# Patient Record
Sex: Male | Born: 1955
Health system: Southern US, Community
[De-identification: ages and names within clinical notes are randomized; demographics above are authoritative.]

## PROBLEM LIST (undated history)

## (undated) DIAGNOSIS — G8929 Other chronic pain: Secondary | ICD-10-CM

## (undated) DIAGNOSIS — M1990 Unspecified osteoarthritis, unspecified site: Secondary | ICD-10-CM

## (undated) DIAGNOSIS — K219 Gastro-esophageal reflux disease without esophagitis: Secondary | ICD-10-CM

## (undated) DIAGNOSIS — H409 Unspecified glaucoma: Secondary | ICD-10-CM

## (undated) DIAGNOSIS — M549 Dorsalgia, unspecified: Secondary | ICD-10-CM

## (undated) DIAGNOSIS — Z87442 Personal history of urinary calculi: Secondary | ICD-10-CM

## (undated) HISTORY — PX: TYMPANOPLASTY: SHX33

## (undated) HISTORY — PX: ORCHIECTOMY: SHX2116

## (undated) HISTORY — PX: EYE SURGERY: SHX253

---

## 2001-02-23 ENCOUNTER — Other Ambulatory Visit: Admission: RE | Admit: 2001-02-23 | Discharge: 2001-02-23 | Payer: Self-pay | Admitting: Family Medicine

## 2012-04-14 ENCOUNTER — Ambulatory Visit (INDEPENDENT_AMBULATORY_CARE_PROVIDER_SITE_OTHER): Payer: Medicare HMO | Admitting: Urology

## 2012-04-14 DIAGNOSIS — E291 Testicular hypofunction: Secondary | ICD-10-CM

## 2012-04-14 DIAGNOSIS — N529 Male erectile dysfunction, unspecified: Secondary | ICD-10-CM

## 2013-10-19 ENCOUNTER — Other Ambulatory Visit: Payer: Self-pay | Admitting: Neurology

## 2013-10-19 DIAGNOSIS — IMO0002 Reserved for concepts with insufficient information to code with codable children: Secondary | ICD-10-CM

## 2013-10-30 ENCOUNTER — Ambulatory Visit (HOSPITAL_COMMUNITY): Payer: Medicare HMO

## 2013-11-01 ENCOUNTER — Ambulatory Visit (HOSPITAL_COMMUNITY)
Admission: RE | Admit: 2013-11-01 | Discharge: 2013-11-01 | Disposition: A | Discharge status: Home / Self Care | Payer: Medicare HMO | Source: Ambulatory Visit | Attending: Neurology | Admitting: Neurology

## 2013-11-01 DIAGNOSIS — M545 Low back pain, unspecified: Secondary | ICD-10-CM | POA: Insufficient documentation

## 2013-11-01 DIAGNOSIS — R209 Unspecified disturbances of skin sensation: Secondary | ICD-10-CM | POA: Insufficient documentation

## 2013-11-01 DIAGNOSIS — M79609 Pain in unspecified limb: Secondary | ICD-10-CM | POA: Insufficient documentation

## 2013-11-01 DIAGNOSIS — IMO0002 Reserved for concepts with insufficient information to code with codable children: Secondary | ICD-10-CM

## 2014-07-12 DIAGNOSIS — M5416 Radiculopathy, lumbar region: Secondary | ICD-10-CM | POA: Diagnosis not present

## 2014-07-12 DIAGNOSIS — M545 Low back pain: Secondary | ICD-10-CM | POA: Diagnosis not present

## 2014-08-01 DIAGNOSIS — H401221 Low-tension glaucoma, left eye, mild stage: Secondary | ICD-10-CM | POA: Diagnosis not present

## 2014-09-09 DIAGNOSIS — R635 Abnormal weight gain: Secondary | ICD-10-CM | POA: Diagnosis not present

## 2014-09-09 DIAGNOSIS — R252 Cramp and spasm: Secondary | ICD-10-CM | POA: Diagnosis not present

## 2014-09-09 DIAGNOSIS — Z79899 Other long term (current) drug therapy: Secondary | ICD-10-CM | POA: Diagnosis not present

## 2014-09-09 DIAGNOSIS — M5416 Radiculopathy, lumbar region: Secondary | ICD-10-CM | POA: Diagnosis not present

## 2014-09-30 DIAGNOSIS — K1379 Other lesions of oral mucosa: Secondary | ICD-10-CM | POA: Diagnosis not present

## 2014-11-08 DIAGNOSIS — H40053 Ocular hypertension, bilateral: Secondary | ICD-10-CM | POA: Diagnosis not present

## 2014-11-08 DIAGNOSIS — H401213 Low-tension glaucoma, right eye, severe stage: Secondary | ICD-10-CM | POA: Diagnosis not present

## 2014-11-08 DIAGNOSIS — H11153 Pinguecula, bilateral: Secondary | ICD-10-CM | POA: Diagnosis not present

## 2014-11-08 DIAGNOSIS — H401221 Low-tension glaucoma, left eye, mild stage: Secondary | ICD-10-CM | POA: Diagnosis not present

## 2014-12-03 DIAGNOSIS — R252 Cramp and spasm: Secondary | ICD-10-CM | POA: Diagnosis not present

## 2014-12-03 DIAGNOSIS — R635 Abnormal weight gain: Secondary | ICD-10-CM | POA: Diagnosis not present

## 2014-12-03 DIAGNOSIS — M545 Low back pain: Secondary | ICD-10-CM | POA: Diagnosis not present

## 2014-12-03 DIAGNOSIS — G894 Chronic pain syndrome: Secondary | ICD-10-CM | POA: Diagnosis not present

## 2014-12-03 DIAGNOSIS — M25519 Pain in unspecified shoulder: Secondary | ICD-10-CM | POA: Diagnosis not present

## 2014-12-03 DIAGNOSIS — Z79899 Other long term (current) drug therapy: Secondary | ICD-10-CM | POA: Diagnosis not present

## 2014-12-03 DIAGNOSIS — M5416 Radiculopathy, lumbar region: Secondary | ICD-10-CM | POA: Diagnosis not present

## 2014-12-10 DIAGNOSIS — R739 Hyperglycemia, unspecified: Secondary | ICD-10-CM | POA: Diagnosis not present

## 2014-12-10 DIAGNOSIS — J309 Allergic rhinitis, unspecified: Secondary | ICD-10-CM | POA: Diagnosis not present

## 2014-12-10 DIAGNOSIS — G894 Chronic pain syndrome: Secondary | ICD-10-CM | POA: Diagnosis not present

## 2014-12-10 DIAGNOSIS — B351 Tinea unguium: Secondary | ICD-10-CM | POA: Diagnosis not present

## 2014-12-11 DIAGNOSIS — H401221 Low-tension glaucoma, left eye, mild stage: Secondary | ICD-10-CM | POA: Diagnosis not present

## 2014-12-11 DIAGNOSIS — H401213 Low-tension glaucoma, right eye, severe stage: Secondary | ICD-10-CM | POA: Diagnosis not present

## 2015-02-04 DIAGNOSIS — Z79899 Other long term (current) drug therapy: Secondary | ICD-10-CM | POA: Diagnosis not present

## 2015-02-04 DIAGNOSIS — M5416 Radiculopathy, lumbar region: Secondary | ICD-10-CM | POA: Diagnosis not present

## 2015-02-04 DIAGNOSIS — R252 Cramp and spasm: Secondary | ICD-10-CM | POA: Diagnosis not present

## 2015-02-04 DIAGNOSIS — M79606 Pain in leg, unspecified: Secondary | ICD-10-CM | POA: Diagnosis not present

## 2015-02-14 DIAGNOSIS — M5136 Other intervertebral disc degeneration, lumbar region: Secondary | ICD-10-CM | POA: Diagnosis not present

## 2015-02-14 DIAGNOSIS — M47816 Spondylosis without myelopathy or radiculopathy, lumbar region: Secondary | ICD-10-CM | POA: Diagnosis not present

## 2015-02-21 DIAGNOSIS — G8929 Other chronic pain: Secondary | ICD-10-CM | POA: Diagnosis not present

## 2015-02-21 DIAGNOSIS — K219 Gastro-esophageal reflux disease without esophagitis: Secondary | ICD-10-CM | POA: Diagnosis not present

## 2015-02-21 DIAGNOSIS — M47816 Spondylosis without myelopathy or radiculopathy, lumbar region: Secondary | ICD-10-CM | POA: Diagnosis not present

## 2015-02-21 DIAGNOSIS — Z79899 Other long term (current) drug therapy: Secondary | ICD-10-CM | POA: Diagnosis not present

## 2015-02-21 DIAGNOSIS — F329 Major depressive disorder, single episode, unspecified: Secondary | ICD-10-CM | POA: Diagnosis not present

## 2015-02-21 DIAGNOSIS — F419 Anxiety disorder, unspecified: Secondary | ICD-10-CM | POA: Diagnosis not present

## 2015-02-21 DIAGNOSIS — H269 Unspecified cataract: Secondary | ICD-10-CM | POA: Diagnosis not present

## 2015-02-21 DIAGNOSIS — M5136 Other intervertebral disc degeneration, lumbar region: Secondary | ICD-10-CM | POA: Diagnosis not present

## 2015-03-06 DIAGNOSIS — M5416 Radiculopathy, lumbar region: Secondary | ICD-10-CM | POA: Diagnosis not present

## 2015-03-06 DIAGNOSIS — Z79899 Other long term (current) drug therapy: Secondary | ICD-10-CM | POA: Diagnosis not present

## 2015-03-06 DIAGNOSIS — M79606 Pain in leg, unspecified: Secondary | ICD-10-CM | POA: Diagnosis not present

## 2015-03-06 DIAGNOSIS — R252 Cramp and spasm: Secondary | ICD-10-CM | POA: Diagnosis not present

## 2015-03-07 DIAGNOSIS — G894 Chronic pain syndrome: Secondary | ICD-10-CM | POA: Diagnosis not present

## 2015-03-07 DIAGNOSIS — R739 Hyperglycemia, unspecified: Secondary | ICD-10-CM | POA: Diagnosis not present

## 2015-03-07 DIAGNOSIS — B351 Tinea unguium: Secondary | ICD-10-CM | POA: Diagnosis not present

## 2015-03-07 DIAGNOSIS — R42 Dizziness and giddiness: Secondary | ICD-10-CM | POA: Diagnosis not present

## 2015-03-25 DIAGNOSIS — M47816 Spondylosis without myelopathy or radiculopathy, lumbar region: Secondary | ICD-10-CM | POA: Diagnosis not present

## 2015-03-25 DIAGNOSIS — M5136 Other intervertebral disc degeneration, lumbar region: Secondary | ICD-10-CM | POA: Diagnosis not present

## 2015-04-01 ENCOUNTER — Other Ambulatory Visit (HOSPITAL_COMMUNITY): Payer: Self-pay | Admitting: Nurse Practitioner

## 2015-04-01 DIAGNOSIS — M47816 Spondylosis without myelopathy or radiculopathy, lumbar region: Secondary | ICD-10-CM

## 2015-04-01 DIAGNOSIS — M51369 Other intervertebral disc degeneration, lumbar region without mention of lumbar back pain or lower extremity pain: Secondary | ICD-10-CM

## 2015-04-01 DIAGNOSIS — M5136 Other intervertebral disc degeneration, lumbar region: Secondary | ICD-10-CM

## 2015-04-14 ENCOUNTER — Ambulatory Visit (HOSPITAL_COMMUNITY)
Admission: RE | Admit: 2015-04-14 | Discharge: 2015-04-14 | Disposition: A | Discharge status: Home / Self Care | Payer: Commercial Managed Care - HMO | Source: Ambulatory Visit | Attending: Nurse Practitioner | Admitting: Nurse Practitioner

## 2015-04-14 DIAGNOSIS — M5136 Other intervertebral disc degeneration, lumbar region: Secondary | ICD-10-CM

## 2015-04-14 DIAGNOSIS — M47816 Spondylosis without myelopathy or radiculopathy, lumbar region: Secondary | ICD-10-CM

## 2015-04-14 DIAGNOSIS — M545 Low back pain: Secondary | ICD-10-CM | POA: Diagnosis not present

## 2015-05-06 DIAGNOSIS — Z0100 Encounter for examination of eyes and vision without abnormal findings: Secondary | ICD-10-CM | POA: Diagnosis not present

## 2015-05-30 DIAGNOSIS — B370 Candidal stomatitis: Secondary | ICD-10-CM | POA: Diagnosis not present

## 2015-05-30 DIAGNOSIS — B085 Enteroviral vesicular pharyngitis: Secondary | ICD-10-CM | POA: Diagnosis not present

## 2015-06-04 DIAGNOSIS — M2550 Pain in unspecified joint: Secondary | ICD-10-CM | POA: Diagnosis not present

## 2015-06-04 DIAGNOSIS — R635 Abnormal weight gain: Secondary | ICD-10-CM | POA: Diagnosis not present

## 2015-06-04 DIAGNOSIS — M545 Low back pain: Secondary | ICD-10-CM | POA: Diagnosis not present

## 2015-06-04 DIAGNOSIS — R252 Cramp and spasm: Secondary | ICD-10-CM | POA: Diagnosis not present

## 2015-06-04 DIAGNOSIS — G894 Chronic pain syndrome: Secondary | ICD-10-CM | POA: Diagnosis not present

## 2015-06-04 DIAGNOSIS — M5416 Radiculopathy, lumbar region: Secondary | ICD-10-CM | POA: Diagnosis not present

## 2015-06-04 DIAGNOSIS — K219 Gastro-esophageal reflux disease without esophagitis: Secondary | ICD-10-CM | POA: Diagnosis not present

## 2015-06-04 DIAGNOSIS — M25519 Pain in unspecified shoulder: Secondary | ICD-10-CM | POA: Diagnosis not present

## 2015-08-05 DIAGNOSIS — M5136 Other intervertebral disc degeneration, lumbar region: Secondary | ICD-10-CM | POA: Diagnosis not present

## 2015-08-05 DIAGNOSIS — M47816 Spondylosis without myelopathy or radiculopathy, lumbar region: Secondary | ICD-10-CM | POA: Diagnosis not present

## 2015-08-31 DIAGNOSIS — D649 Anemia, unspecified: Secondary | ICD-10-CM | POA: Diagnosis not present

## 2015-08-31 DIAGNOSIS — H409 Unspecified glaucoma: Secondary | ICD-10-CM | POA: Diagnosis not present

## 2015-08-31 DIAGNOSIS — R0902 Hypoxemia: Secondary | ICD-10-CM | POA: Diagnosis not present

## 2015-08-31 DIAGNOSIS — J189 Pneumonia, unspecified organism: Secondary | ICD-10-CM | POA: Diagnosis not present

## 2015-08-31 DIAGNOSIS — I517 Cardiomegaly: Secondary | ICD-10-CM | POA: Diagnosis not present

## 2015-08-31 DIAGNOSIS — R0602 Shortness of breath: Secondary | ICD-10-CM | POA: Diagnosis not present

## 2015-08-31 DIAGNOSIS — J441 Chronic obstructive pulmonary disease with (acute) exacerbation: Secondary | ICD-10-CM | POA: Diagnosis not present

## 2015-08-31 DIAGNOSIS — E875 Hyperkalemia: Secondary | ICD-10-CM | POA: Diagnosis not present

## 2015-08-31 DIAGNOSIS — R410 Disorientation, unspecified: Secondary | ICD-10-CM | POA: Diagnosis not present

## 2015-08-31 DIAGNOSIS — R42 Dizziness and giddiness: Secondary | ICD-10-CM | POA: Diagnosis not present

## 2015-08-31 DIAGNOSIS — J9601 Acute respiratory failure with hypoxia: Secondary | ICD-10-CM | POA: Diagnosis not present

## 2015-08-31 DIAGNOSIS — K219 Gastro-esophageal reflux disease without esophagitis: Secondary | ICD-10-CM | POA: Diagnosis not present

## 2015-09-10 DIAGNOSIS — F411 Generalized anxiety disorder: Secondary | ICD-10-CM | POA: Diagnosis not present

## 2015-09-10 DIAGNOSIS — G894 Chronic pain syndrome: Secondary | ICD-10-CM | POA: Diagnosis not present

## 2015-09-10 DIAGNOSIS — J189 Pneumonia, unspecified organism: Secondary | ICD-10-CM | POA: Diagnosis not present

## 2015-09-10 DIAGNOSIS — R739 Hyperglycemia, unspecified: Secondary | ICD-10-CM | POA: Diagnosis not present

## 2015-09-24 DIAGNOSIS — M2550 Pain in unspecified joint: Secondary | ICD-10-CM | POA: Diagnosis not present

## 2015-09-24 DIAGNOSIS — M5416 Radiculopathy, lumbar region: Secondary | ICD-10-CM | POA: Diagnosis not present

## 2015-09-24 DIAGNOSIS — R635 Abnormal weight gain: Secondary | ICD-10-CM | POA: Diagnosis not present

## 2015-09-24 DIAGNOSIS — K219 Gastro-esophageal reflux disease without esophagitis: Secondary | ICD-10-CM | POA: Diagnosis not present

## 2015-09-24 DIAGNOSIS — M25519 Pain in unspecified shoulder: Secondary | ICD-10-CM | POA: Diagnosis not present

## 2015-09-24 DIAGNOSIS — R252 Cramp and spasm: Secondary | ICD-10-CM | POA: Diagnosis not present

## 2015-09-24 DIAGNOSIS — Z79899 Other long term (current) drug therapy: Secondary | ICD-10-CM | POA: Diagnosis not present

## 2015-09-24 DIAGNOSIS — G894 Chronic pain syndrome: Secondary | ICD-10-CM | POA: Diagnosis not present

## 2015-09-24 DIAGNOSIS — M545 Low back pain: Secondary | ICD-10-CM | POA: Diagnosis not present

## 2015-10-03 DIAGNOSIS — F411 Generalized anxiety disorder: Secondary | ICD-10-CM | POA: Diagnosis not present

## 2015-10-03 DIAGNOSIS — G894 Chronic pain syndrome: Secondary | ICD-10-CM | POA: Diagnosis not present

## 2015-10-03 DIAGNOSIS — J189 Pneumonia, unspecified organism: Secondary | ICD-10-CM | POA: Diagnosis not present

## 2015-10-03 DIAGNOSIS — R739 Hyperglycemia, unspecified: Secondary | ICD-10-CM | POA: Diagnosis not present

## 2015-10-08 DIAGNOSIS — F419 Anxiety disorder, unspecified: Secondary | ICD-10-CM | POA: Diagnosis not present

## 2015-10-08 DIAGNOSIS — R7303 Prediabetes: Secondary | ICD-10-CM | POA: Diagnosis not present

## 2015-10-08 DIAGNOSIS — R73 Elevated blood glucose level: Secondary | ICD-10-CM | POA: Diagnosis not present

## 2015-10-08 DIAGNOSIS — G894 Chronic pain syndrome: Secondary | ICD-10-CM | POA: Diagnosis not present

## 2015-10-10 DIAGNOSIS — R635 Abnormal weight gain: Secondary | ICD-10-CM | POA: Diagnosis not present

## 2015-10-10 DIAGNOSIS — M2550 Pain in unspecified joint: Secondary | ICD-10-CM | POA: Diagnosis not present

## 2015-10-10 DIAGNOSIS — K219 Gastro-esophageal reflux disease without esophagitis: Secondary | ICD-10-CM | POA: Diagnosis not present

## 2015-10-10 DIAGNOSIS — Z79899 Other long term (current) drug therapy: Secondary | ICD-10-CM | POA: Diagnosis not present

## 2015-10-10 DIAGNOSIS — M25519 Pain in unspecified shoulder: Secondary | ICD-10-CM | POA: Diagnosis not present

## 2015-10-10 DIAGNOSIS — R252 Cramp and spasm: Secondary | ICD-10-CM | POA: Diagnosis not present

## 2015-10-10 DIAGNOSIS — M545 Low back pain: Secondary | ICD-10-CM | POA: Diagnosis not present

## 2015-10-10 DIAGNOSIS — G894 Chronic pain syndrome: Secondary | ICD-10-CM | POA: Diagnosis not present

## 2015-10-10 DIAGNOSIS — M5416 Radiculopathy, lumbar region: Secondary | ICD-10-CM | POA: Diagnosis not present

## 2015-10-15 DIAGNOSIS — G600 Hereditary motor and sensory neuropathy: Secondary | ICD-10-CM | POA: Diagnosis not present

## 2015-11-20 DIAGNOSIS — Z79899 Other long term (current) drug therapy: Secondary | ICD-10-CM | POA: Diagnosis not present

## 2015-11-20 DIAGNOSIS — M5416 Radiculopathy, lumbar region: Secondary | ICD-10-CM | POA: Diagnosis not present

## 2015-11-20 DIAGNOSIS — M545 Low back pain: Secondary | ICD-10-CM | POA: Diagnosis not present

## 2015-11-20 DIAGNOSIS — M2550 Pain in unspecified joint: Secondary | ICD-10-CM | POA: Diagnosis not present

## 2015-11-20 DIAGNOSIS — M25519 Pain in unspecified shoulder: Secondary | ICD-10-CM | POA: Diagnosis not present

## 2015-11-20 DIAGNOSIS — R252 Cramp and spasm: Secondary | ICD-10-CM | POA: Diagnosis not present

## 2015-11-20 DIAGNOSIS — G894 Chronic pain syndrome: Secondary | ICD-10-CM | POA: Diagnosis not present

## 2015-11-20 DIAGNOSIS — K219 Gastro-esophageal reflux disease without esophagitis: Secondary | ICD-10-CM | POA: Diagnosis not present

## 2015-11-20 DIAGNOSIS — R635 Abnormal weight gain: Secondary | ICD-10-CM | POA: Diagnosis not present

## 2015-12-04 DIAGNOSIS — M5416 Radiculopathy, lumbar region: Secondary | ICD-10-CM | POA: Diagnosis not present

## 2015-12-04 DIAGNOSIS — M545 Low back pain: Secondary | ICD-10-CM | POA: Diagnosis not present

## 2015-12-04 DIAGNOSIS — M25519 Pain in unspecified shoulder: Secondary | ICD-10-CM | POA: Diagnosis not present

## 2015-12-04 DIAGNOSIS — G894 Chronic pain syndrome: Secondary | ICD-10-CM | POA: Diagnosis not present

## 2015-12-04 DIAGNOSIS — M2550 Pain in unspecified joint: Secondary | ICD-10-CM | POA: Diagnosis not present

## 2015-12-04 DIAGNOSIS — Z79899 Other long term (current) drug therapy: Secondary | ICD-10-CM | POA: Diagnosis not present

## 2015-12-04 DIAGNOSIS — R252 Cramp and spasm: Secondary | ICD-10-CM | POA: Diagnosis not present

## 2015-12-04 DIAGNOSIS — K219 Gastro-esophageal reflux disease without esophagitis: Secondary | ICD-10-CM | POA: Diagnosis not present

## 2015-12-04 DIAGNOSIS — R635 Abnormal weight gain: Secondary | ICD-10-CM | POA: Diagnosis not present

## 2015-12-22 ENCOUNTER — Encounter: Payer: Commercial Managed Care - HMO | Admitting: Neurology

## 2015-12-24 DIAGNOSIS — F419 Anxiety disorder, unspecified: Secondary | ICD-10-CM | POA: Diagnosis not present

## 2015-12-24 DIAGNOSIS — K219 Gastro-esophageal reflux disease without esophagitis: Secondary | ICD-10-CM | POA: Diagnosis not present

## 2015-12-24 DIAGNOSIS — R739 Hyperglycemia, unspecified: Secondary | ICD-10-CM | POA: Diagnosis not present

## 2016-01-01 DIAGNOSIS — R252 Cramp and spasm: Secondary | ICD-10-CM | POA: Diagnosis not present

## 2016-01-01 DIAGNOSIS — M2550 Pain in unspecified joint: Secondary | ICD-10-CM | POA: Diagnosis not present

## 2016-01-01 DIAGNOSIS — M545 Low back pain: Secondary | ICD-10-CM | POA: Diagnosis not present

## 2016-01-01 DIAGNOSIS — K219 Gastro-esophageal reflux disease without esophagitis: Secondary | ICD-10-CM | POA: Diagnosis not present

## 2016-01-01 DIAGNOSIS — G894 Chronic pain syndrome: Secondary | ICD-10-CM | POA: Diagnosis not present

## 2016-01-01 DIAGNOSIS — M25519 Pain in unspecified shoulder: Secondary | ICD-10-CM | POA: Diagnosis not present

## 2016-01-01 DIAGNOSIS — R635 Abnormal weight gain: Secondary | ICD-10-CM | POA: Diagnosis not present

## 2016-01-01 DIAGNOSIS — Z79899 Other long term (current) drug therapy: Secondary | ICD-10-CM | POA: Diagnosis not present

## 2016-01-01 DIAGNOSIS — M5416 Radiculopathy, lumbar region: Secondary | ICD-10-CM | POA: Diagnosis not present

## 2016-01-07 ENCOUNTER — Encounter (INDEPENDENT_AMBULATORY_CARE_PROVIDER_SITE_OTHER): Payer: Self-pay | Admitting: Neurology

## 2016-01-07 ENCOUNTER — Ambulatory Visit (INDEPENDENT_AMBULATORY_CARE_PROVIDER_SITE_OTHER): Payer: Commercial Managed Care - HMO | Admitting: Neurology

## 2016-01-07 DIAGNOSIS — R202 Paresthesia of skin: Secondary | ICD-10-CM

## 2016-01-07 DIAGNOSIS — Z0289 Encounter for other administrative examinations: Secondary | ICD-10-CM

## 2016-01-08 NOTE — Procedures (Signed)
   NCS (NERVE CONDUCTION STUDY) WITH EMG (ELECTROMYOGRAPHY) REPORT   STUDY DATE:  January 08 2016 PATIENT NAME: Steven Richard DOB: Nov 29, 1955 MRN: [id number]    TECHNOLOGIST: Laretta Alstrom ELECTROMYOGRAPHER: Marcial Pacas M.D.  CLINICAL INFORMATION:  60 years old male, with history of chronic low back pain, bilateral feet paresthesia, gait abnormality, reported a family history of Charcot-Marie-Tooth disease.  On examination: Bilateral upper and lower extremity motor strength is normal. Sensory examination was intact to vibratory sensation, proprioception, pinprick, and light touch. He was areflexia.   FINDINGS: NERVE CONDUCTION STUDY: Bilateral peroneal sensory responses were normal. Bilateral peroneal to EDB and tibial motor responses were normal. Bilateral tibial H reflexes were normal and symmetric. Right median, ulnar sensory and motor responses were normal. Conduction velocity was within the normal range,   NEEDLE ELECTROMYOGRAPHY: Selected needle examinations were performed at bilateral lower extremity muscles, bilateral lumbar sacral paraspinal muscles.   Needle examination of bilateral tibialis anterior, tibialis posterior, medial gastrocnemius, peroneal longus, vastus lateralis was normal.  There was no spontaneous activity at bilateral lumbar sacral paraspinal muscles, bilateral L4-5 S1.  IMPRESSION:   This is a normal study, there is no electrodiagnostic evidence of large fiber peripheral neuropathy, lumbosacral radiculopathy.   INTERPRETING PHYSICIAN:   Marcial Pacas M.D. Ph.D. [facility] [address] [place], [place] [address] [phone]

## 2016-01-22 DIAGNOSIS — F419 Anxiety disorder, unspecified: Secondary | ICD-10-CM | POA: Diagnosis not present

## 2016-01-22 DIAGNOSIS — Z6832 Body mass index (BMI) 32.0-32.9, adult: Secondary | ICD-10-CM | POA: Diagnosis not present

## 2016-01-22 DIAGNOSIS — G894 Chronic pain syndrome: Secondary | ICD-10-CM | POA: Diagnosis not present

## 2016-01-22 DIAGNOSIS — E119 Type 2 diabetes mellitus without complications: Secondary | ICD-10-CM | POA: Diagnosis not present

## 2016-03-22 DIAGNOSIS — M5416 Radiculopathy, lumbar region: Secondary | ICD-10-CM | POA: Diagnosis not present

## 2016-03-22 DIAGNOSIS — R252 Cramp and spasm: Secondary | ICD-10-CM | POA: Diagnosis not present

## 2016-03-22 DIAGNOSIS — M25519 Pain in unspecified shoulder: Secondary | ICD-10-CM | POA: Diagnosis not present

## 2016-03-22 DIAGNOSIS — M545 Low back pain: Secondary | ICD-10-CM | POA: Diagnosis not present

## 2016-03-22 DIAGNOSIS — K219 Gastro-esophageal reflux disease without esophagitis: Secondary | ICD-10-CM | POA: Diagnosis not present

## 2016-03-22 DIAGNOSIS — M2550 Pain in unspecified joint: Secondary | ICD-10-CM | POA: Diagnosis not present

## 2016-03-22 DIAGNOSIS — Z79891 Long term (current) use of opiate analgesic: Secondary | ICD-10-CM | POA: Diagnosis not present

## 2016-03-22 DIAGNOSIS — Z79899 Other long term (current) drug therapy: Secondary | ICD-10-CM | POA: Diagnosis not present

## 2016-03-22 DIAGNOSIS — R635 Abnormal weight gain: Secondary | ICD-10-CM | POA: Diagnosis not present

## 2016-03-22 DIAGNOSIS — G894 Chronic pain syndrome: Secondary | ICD-10-CM | POA: Diagnosis not present

## 2016-05-20 DIAGNOSIS — E119 Type 2 diabetes mellitus without complications: Secondary | ICD-10-CM | POA: Diagnosis not present

## 2016-05-20 DIAGNOSIS — R739 Hyperglycemia, unspecified: Secondary | ICD-10-CM | POA: Diagnosis not present

## 2016-05-20 DIAGNOSIS — K219 Gastro-esophageal reflux disease without esophagitis: Secondary | ICD-10-CM | POA: Diagnosis not present

## 2016-05-24 DIAGNOSIS — F419 Anxiety disorder, unspecified: Secondary | ICD-10-CM | POA: Diagnosis not present

## 2016-05-24 DIAGNOSIS — L732 Hidradenitis suppurativa: Secondary | ICD-10-CM | POA: Diagnosis not present

## 2016-05-24 DIAGNOSIS — G894 Chronic pain syndrome: Secondary | ICD-10-CM | POA: Diagnosis not present

## 2016-05-24 DIAGNOSIS — Z6832 Body mass index (BMI) 32.0-32.9, adult: Secondary | ICD-10-CM | POA: Diagnosis not present

## 2016-05-24 DIAGNOSIS — E119 Type 2 diabetes mellitus without complications: Secondary | ICD-10-CM | POA: Diagnosis not present

## 2016-05-24 DIAGNOSIS — Z23 Encounter for immunization: Secondary | ICD-10-CM | POA: Diagnosis not present

## 2016-05-25 DIAGNOSIS — H524 Presbyopia: Secondary | ICD-10-CM | POA: Diagnosis not present

## 2016-05-25 DIAGNOSIS — H52203 Unspecified astigmatism, bilateral: Secondary | ICD-10-CM | POA: Diagnosis not present

## 2016-05-25 DIAGNOSIS — H5213 Myopia, bilateral: Secondary | ICD-10-CM | POA: Diagnosis not present

## 2016-05-25 DIAGNOSIS — H2513 Age-related nuclear cataract, bilateral: Secondary | ICD-10-CM | POA: Diagnosis not present

## 2016-05-25 DIAGNOSIS — H2511 Age-related nuclear cataract, right eye: Secondary | ICD-10-CM | POA: Diagnosis not present

## 2016-05-25 DIAGNOSIS — H401131 Primary open-angle glaucoma, bilateral, mild stage: Secondary | ICD-10-CM | POA: Diagnosis not present

## 2016-05-26 ENCOUNTER — Encounter (HOSPITAL_COMMUNITY)
Admission: RE | Admit: 2016-05-26 | Discharge: 2016-05-26 | Disposition: A | Discharge status: Home / Self Care | Payer: Commercial Managed Care - HMO | Source: Ambulatory Visit | Attending: Ophthalmology | Admitting: Ophthalmology

## 2016-05-26 ENCOUNTER — Encounter (HOSPITAL_COMMUNITY): Payer: Self-pay

## 2016-05-26 DIAGNOSIS — Z01810 Encounter for preprocedural cardiovascular examination: Secondary | ICD-10-CM | POA: Diagnosis not present

## 2016-05-26 DIAGNOSIS — Z01812 Encounter for preprocedural laboratory examination: Secondary | ICD-10-CM | POA: Insufficient documentation

## 2016-05-26 HISTORY — DX: Unspecified osteoarthritis, unspecified site: M19.90

## 2016-05-26 HISTORY — DX: Personal history of urinary calculi: Z87.442

## 2016-05-26 HISTORY — DX: Gastro-esophageal reflux disease without esophagitis: K21.9

## 2016-05-26 HISTORY — DX: Unspecified glaucoma: H40.9

## 2016-05-26 HISTORY — DX: Dorsalgia, unspecified: M54.9

## 2016-05-26 HISTORY — DX: Other chronic pain: G89.29

## 2016-05-26 LAB — BASIC METABOLIC PANEL
Anion gap: 7 (ref 5–15)
BUN: 14 mg/dL (ref 6–20)
CHLORIDE: 105 mmol/L (ref 101–111)
CO2: 23 mmol/L (ref 22–32)
CREATININE: 1.19 mg/dL (ref 0.61–1.24)
Calcium: 9 mg/dL (ref 8.9–10.3)
GFR calc non Af Amer: >60 mL/min (ref >60)
Glucose, Bld: 109 mg/dL — ABNORMAL HIGH (ref 65–99)
POTASSIUM: 4.3 mmol/L (ref 3.5–5.1)
Sodium: 135 mmol/L (ref 135–145)

## 2016-05-26 LAB — CBC WITH DIFFERENTIAL/PLATELET
Basophils Absolute: 0.1 10*3/uL (ref 0.0–0.1)
Basophils Relative: 1 %
Eosinophils Absolute: 0.2 10*3/uL (ref 0.0–0.7)
Eosinophils Relative: 2 %
HEMATOCRIT: 40.8 % (ref 39.0–52.0)
HEMOGLOBIN: 13.6 g/dL (ref 13.0–17.0)
LYMPHS ABS: 1.7 10*3/uL (ref 0.7–4.0)
Lymphocytes Relative: 24 %
MCH: 28.3 pg (ref 26.0–34.0)
MCHC: 33.3 g/dL (ref 30.0–36.0)
MCV: 85 fL (ref 78.0–100.0)
MONOS PCT: 7 %
Monocytes Absolute: 0.5 10*3/uL (ref 0.1–1.0)
NEUTROS ABS: 4.9 10*3/uL (ref 1.7–7.7)
NEUTROS PCT: 66 %
Platelets: 226 10*3/uL (ref 150–400)
RBC: 4.8 MIL/uL (ref 4.22–5.81)
RDW: 14.1 % (ref 11.5–15.5)
WBC: 7.4 10*3/uL (ref 4.0–10.5)

## 2016-05-26 NOTE — Patient Instructions (Addendum)
MAXTYN AVENT  05/26/2016     @PREFPERIOPPHARMACY @   Your procedure is scheduled on 06/01/2016.  Report to [facility] at 9:00 A.M.  Call this number if you have problems the morning of surgery:xanax, cymbalta, nexium, mobic, oxycodone, paxil, lyrica.  [phone]   Remember:  Do not eat food or drink liquids after midnight.  Take these medicines the morning of surgery with A SIP OF WATER    Do not wear jewelry, make-up or nail polish.  Do not wear lotions, powders, or perfumes, or deoderant.  Do not shave 48 hours prior to surgery.  Men may shave face and neck.  Do not bring valuables to the hospital.  [facility] is not responsible for any belongings or valuables.  Contacts, dentures or bridgework may not be worn into surgery.  Leave your suitcase in the car.  After surgery it may be brought to your room.  For patients admitted to the hospital, discharge time will be determined by your treatment team.  Patients discharged the day of surgery will not be allowed to drive home.    Please read over the following fact sheets that you were given. Anesthesia Post-op Instructions     PATIENT INSTRUCTIONS POST-ANESTHESIA  IMMEDIATELY FOLLOWING SURGERY:  Do not drive or operate machinery for the first twenty four hours after surgery.  Do not make any important decisions for twenty four hours after surgery or while taking narcotic pain medications or sedatives.  If you develop intractable nausea and vomiting or a severe headache please notify your doctor immediately.  FOLLOW-UP:  Please make an appointment with your surgeon as instructed. You do not need to follow up with anesthesia unless specifically instructed to do so.  WOUND CARE INSTRUCTIONS (if applicable):  Keep a dry clean dressing on the anesthesia/puncture wound site if there is drainage.  Once the wound has quit draining you may leave it open to air.  Generally you should leave the bandage intact for twenty four hours  unless there is drainage.  If the epidural site drains for more than 36-48 hours please call the anesthesia department.  QUESTIONS?:  Please feel free to call your physician or the hospital operator if you have any questions, and they will be happy to assist you.      Cataract Surgery Cataract surgery is a procedure to remove a cataract from your eye. A cataract is cloudiness on the lens of your eye. The lens focuses light inside the eye. When a lens becomes cloudy, your vision is affected. Cataract surgery is a procedure to remove the cloudy lens. A substitute lens (intraocular lens or IOL) is usually inserted as a replacement for the cloudy lens. Tell a health care provider about:  Any allergies you have.  All medicines you are taking, including vitamins, herbs, eye drops, creams, and over-the-counter medicines.  Any problems you or family members have had with anesthetic medicines.  Any blood disorders you have.  Any surgeries you have had, especially eye surgeries that include refractive surgery, such as PRK and LASIK.  Any medical conditions you have.  Whether you are pregnant or may be pregnant. What are the risks? Generally, this is a safe procedure. However, problems may occur, including:  Infection.  Bleeding.  Glaucoma.  Retinal detachment.  Allergic reactions to medicines.  Damage to other structures or organs.  Inflammation of the eye.  Clouding of the part of your eye that holds an IOL in place (after-cataract), if an IOL was  inserted. This is fairly common.  An IOL moving out of position, if an IOL was inserted. This is very rare.  Loss of vision. This is rare. What happens before the procedure?  Follow instructions from your health care provider about eating or drinking restrictions.  Ask your health care provider about:  Changing or stopping your regular medicines, including any eye drops you have been prescribed. This is especially important if you  are taking diabetes medicines or blood thinners.  Taking medicines such as aspirin and ibuprofen. These medicines can thin your blood. Do not take these medicines before your procedure if your health care provider instructs you not to.  Do not put contact lenses in either eye on the day of your surgery.  Plan for someone to drive you to and from the procedure.  If you will be going home right after the procedure, plan to have someone with you for 24 hours. What happens during the procedure?  An IV tube may be inserted into one of your veins.  You will be given one or more of the following:  A medicine to help you relax (sedative).  A medicine to numb the area (local anesthetic). This may be numbing eye drops or an injection that is given behind the eye.  A small cut (incision) will be made to the edge of the clear, dome-shaped surface that covers the front of the eye (cornea).  A small probe will be inserted into the eye. This device gives off ultrasound waves that soften and break up the cloudy center of the lens. This makes it easier for the cloudy lens to be removed by suction.  An IOL may be implanted.  Part of the capsule that surrounds the lens will be left in the eye to support the IOL.  Your surgeon may use stitches (sutures) to close the incision. The procedure may vary among health care providers and hospitals. What happens after the procedure?  Your blood pressure, heart rate, breathing rate, and blood oxygen level will be monitored often until the medicines you were given have worn off.  You may be given a protective shield to wear over your eyes.  Do not drive for 24 hours if you received a sedative. This information is not intended to replace advice given to you by your health care provider. Make sure you discuss any questions you have with your health care provider. Document Released: 06/10/2011 Document Revised: 11/27/2015 Document Reviewed: 05/01/2015 Elsevier  Interactive Patient Education  2017 [employer].

## 2016-06-01 ENCOUNTER — Ambulatory Visit (HOSPITAL_COMMUNITY): Payer: Commercial Managed Care - HMO | Admitting: Anesthesiology

## 2016-06-01 ENCOUNTER — Encounter (HOSPITAL_COMMUNITY): Payer: Self-pay

## 2016-06-01 ENCOUNTER — Ambulatory Visit (HOSPITAL_COMMUNITY)
Admission: RE | Admit: 2016-06-01 | Discharge: 2016-06-01 | Disposition: A | Discharge status: Home / Self Care | Payer: Commercial Managed Care - HMO | Source: Ambulatory Visit | Attending: Ophthalmology | Admitting: Ophthalmology

## 2016-06-01 ENCOUNTER — Encounter (HOSPITAL_COMMUNITY)
Admission: RE | Disposition: A | Discharge status: Home / Self Care | Payer: Self-pay | Source: Ambulatory Visit | Attending: Ophthalmology

## 2016-06-01 DIAGNOSIS — Z79899 Other long term (current) drug therapy: Secondary | ICD-10-CM | POA: Diagnosis not present

## 2016-06-01 DIAGNOSIS — H2511 Age-related nuclear cataract, right eye: Secondary | ICD-10-CM | POA: Insufficient documentation

## 2016-06-01 DIAGNOSIS — Z87891 Personal history of nicotine dependence: Secondary | ICD-10-CM | POA: Diagnosis not present

## 2016-06-01 DIAGNOSIS — K219 Gastro-esophageal reflux disease without esophagitis: Secondary | ICD-10-CM | POA: Diagnosis not present

## 2016-06-01 DIAGNOSIS — Z791 Long term (current) use of non-steroidal anti-inflammatories (NSAID): Secondary | ICD-10-CM | POA: Diagnosis not present

## 2016-06-01 HISTORY — PX: CATARACT EXTRACTION W/PHACO: SHX586

## 2016-06-01 SURGERY — PHACOEMULSIFICATION, CATARACT, WITH IOL INSERTION
Anesthesia: Monitor Anesthesia Care | Site: Eye | Laterality: Right

## 2016-06-01 MED ORDER — EPINEPHRINE PF 1 MG/ML IJ SOLN
INTRAMUSCULAR | Status: AC
  Filled 2016-06-01: qty 1

## 2016-06-01 MED ORDER — KETOROLAC TROMETHAMINE 0.5 % OP SOLN
1.0000 [drp] | OPHTHALMIC | Status: AC
  Administered 2016-06-01 (×3): 1 [drp] via OPHTHALMIC

## 2016-06-01 MED ORDER — MIDAZOLAM HCL 2 MG/2ML IJ SOLN
INTRAMUSCULAR | Status: AC
  Filled 2016-06-01: qty 2

## 2016-06-01 MED ORDER — TETRACAINE HCL 0.5 % OP SOLN
1.0000 [drp] | OPHTHALMIC | Status: AC
  Administered 2016-06-01 (×3): 1 [drp] via OPHTHALMIC

## 2016-06-01 MED ORDER — FENTANYL CITRATE (PF) 100 MCG/2ML IJ SOLN
25.0000 ug | INTRAMUSCULAR | Status: DC | PRN
  Administered 2016-06-01: 25 ug via INTRAVENOUS

## 2016-06-01 MED ORDER — PHENYLEPHRINE HCL 2.5 % OP SOLN
1.0000 [drp] | OPHTHALMIC | Status: AC
  Administered 2016-06-01 (×3): 1 [drp] via OPHTHALMIC

## 2016-06-01 MED ORDER — MIDAZOLAM HCL 2 MG/2ML IJ SOLN
1.0000 mg | INTRAMUSCULAR | Status: DC | PRN
  Administered 2016-06-01: 2 mg via INTRAVENOUS

## 2016-06-01 MED ORDER — CYCLOPENTOLATE-PHENYLEPHRINE 0.2-1 % OP SOLN
1.0000 [drp] | OPHTHALMIC | Status: AC
  Administered 2016-06-01 (×3): 1 [drp] via OPHTHALMIC

## 2016-06-01 MED ORDER — PROVISC 10 MG/ML IO SOLN
INTRAOCULAR | Status: DC | PRN
  Administered 2016-06-01: 0.85 mL via INTRAOCULAR

## 2016-06-01 MED ORDER — LACTATED RINGERS IV SOLN
INTRAVENOUS | Status: DC
Start: 2016-06-01 — End: 2016-06-01
  Administered 2016-06-01: 09:00:00 via INTRAVENOUS

## 2016-06-01 MED ORDER — EPINEPHRINE PF 1 MG/ML IJ SOLN
INTRAOCULAR | Status: DC | PRN
  Administered 2016-06-01: 500 mL

## 2016-06-01 MED ORDER — FENTANYL CITRATE (PF) 100 MCG/2ML IJ SOLN
INTRAMUSCULAR | Status: AC
  Filled 2016-06-01: qty 2

## 2016-06-01 MED ORDER — TETRACAINE 0.5 % OP SOLN OPTIME - NO CHARGE
OPHTHALMIC | Status: DC | PRN
  Administered 2016-06-01: 2 [drp] via OPHTHALMIC

## 2016-06-01 MED ORDER — BSS IO SOLN
INTRAOCULAR | Status: DC | PRN
  Administered 2016-06-01: 15 mL

## 2016-06-01 SURGICAL SUPPLY — 9 items
CLOTH BEACON ORANGE TIMEOUT ST (SAFETY) ×2 IMPLANT
EYE SHIELD UNIVERSAL CLEAR (GAUZE/BANDAGES/DRESSINGS) ×2 IMPLANT
GLOVE BIO SURGEON STRL SZ 6.5 (GLOVE) ×2 IMPLANT
GLOVE EXAM NITRILE MD LF STRL (GLOVE) ×2 IMPLANT
LENS ALC ACRYL/TECN (Ophthalmic Related) ×2 IMPLANT
PAD ARMBOARD 7.5X6 YLW CONV (MISCELLANEOUS) ×2 IMPLANT
TAPE SURG TRANSPORE 1 IN (GAUZE/BANDAGES/DRESSINGS) ×1 IMPLANT
TAPE SURGICAL TRANSPORE 1 IN (GAUZE/BANDAGES/DRESSINGS) ×1
WATER STERILE IRR 250ML POUR (IV SOLUTION) ×2 IMPLANT

## 2016-06-01 NOTE — Op Note (Signed)
Patient brought to the operating room and prepped and draped in the usual manner.  Lid speculum inserted in right eye.  Stab incision made at the twelve o'clock position.  Provisc instilled in the anterior chamber.   A 2.4 mm. Stab incision was made temporally.  An anterior capsulotomy was done with a bent 25 gauge needle.  The nucleus was hydrodissected.  The Phaco tip was inserted in the anterior chamber and the nucleus was emulsified.  CDE was 4.34.  The cortical material was then removed with the I and A tip.  Posterior capsule was the polished.  The anterior chamber was deepened with Provisc.  A 8.0 Diopter Alcon AU00T0 IOL was then inserted in the capsular bag.  Provisc was then removed with the I and A tip.  The wound was then hydrated.  Patient sent to the Recovery Room in good condition with follow up in my office.  Preoperative Diagnosis:  Nuclear Cataract OD Postoperative Diagnosis:  Same Procedure name: Kelman Phacoemulsification OD with IOL

## 2016-06-01 NOTE — H&P (Signed)
The patient was re examined and there is no change in the patients condition since the original H and P. 

## 2016-06-01 NOTE — Anesthesia Postprocedure Evaluation (Signed)
  Anesthesia Post-op Note  Patient: Steven Richard  Procedure(s) Performed: Procedure(s) (LRB): CATARACT EXTRACTION PHACO AND INTRAOCULAR LENS PLACEMENT (IOC) (Right)  Patient Location:  Short Stay  Anesthesia Type: MAC  Level of Consciousness: awake  Airway and Oxygen Therapy: Patient Spontanous Breathing  Post-op Pain: none  Post-op Assessment: Post-op Vital signs reviewed, Patient's Cardiovascular Status Stable, Respiratory Function Stable, Patent Airway, No signs of Nausea or vomiting and Pain level controlled  Post-op Vital Signs: Reviewed and stable  Complications: No apparent anesthesia complications Anesthesia Post Note  Patient: Steven Richard  Procedure(s) Performed: Procedure(s) (LRB): CATARACT EXTRACTION PHACO AND INTRAOCULAR LENS PLACEMENT (IOC) (Right)  Anesthesia Post Evaluation  Last Vitals:  Vitals:   06/01/16 0931 06/01/16 0935  Pulse: (!) 53   Resp: 16 (!) 0  Temp: 36.9 C     Last Pain:  Vitals:   06/01/16 0931  TempSrc: Oral                 Julieanna Geraci

## 2016-06-01 NOTE — Discharge Instructions (Signed)
Cataract Surgery, Care After Refer to this sheet in the next few weeks. These instructions provide you with information about caring for yourself after your procedure. Your health care provider may also give you more specific instructions. Your treatment has been planned according to current medical practices, but problems sometimes occur. Call your health care provider if you have any problems or questions after your procedure. What can I expect after the procedure? After the procedure, it is common to have:  Itching.  Discomfort.  Fluid discharge.  Sensitivity to light and to touch.  Bruising. Follow these instructions at home: [place] your eye every day for signs of infection. Watch for:  Redness, swelling, or pain.  Fluid, blood, or pus.  Warmth.  Bad smell. Activity  Avoid strenuous activities, such as playing contact sports, for as long as told by your health care provider.  Do not drive or operate heavy machinery until your health care provider approves.  Do not bend or lift heavy objects. Bending increases pressure in the eye. You can walk, climb stairs, and do light household chores.  Ask your health care provider when you can return to work. If you work in a dusty environment, you may be advised to wear protective eyewear for a period of time. General instructions  Take or apply over-the-counter and prescription medicines only as told by your health care provider. This includes eye drops.  Do not touch or rub your eyes.  If you were given a protective shield, wear it as told by your health care provider. If you were not given a protective shield, wear sunglasses as told by your health care provider to protect your eyes.  Keep the area around your eye clean and dry. Avoid swimming or allowing water to hit you directly in the face while showering until told by your health care provider. Keep soap and shampoo out of your eyes.  Do not put a contact lens  into the affected eye or eyes until your health care provider approves.  Keep all follow-up visits as told by your health care provider. This is important. Contact a health care provider if:   You have increased bruising around your eye.  You have pain that is not helped with medicine.  You have a fever.  You have redness, swelling, or pain in your eye.  You have fluid, blood, or pus coming from your incision.  Your vision gets worse. Get help right away if:  You have sudden vision loss. This information is not intended to replace advice given to you by your health care provider. Make sure you discuss any questions you have with your health care provider. Document Released: 01/08/2005 Document Revised: 10/30/2015 Document Reviewed: 05/01/2015 Elsevier Interactive Patient Education  2017 [place] [facility].   Appointment with Dr. Gershon Crane at 3:30 today.

## 2016-06-01 NOTE — Transfer of Care (Signed)
Immediate Anesthesia Transfer of Care Note  Patient: Steven Richard  Procedure(s) Performed: Procedure(s) (LRB): CATARACT EXTRACTION PHACO AND INTRAOCULAR LENS PLACEMENT (IOC) (Right)  Patient Location: Shortstay  Anesthesia Type: MAC  Level of Consciousness: awake  Airway & Oxygen Therapy: Patient Spontanous Breathing   Post-op Assessment: Report given to PACU RN, Post -op Vital signs reviewed and stable and Patient moving all extremities  Post vital signs: Reviewed and stable  Complications: No apparent anesthesia complications

## 2016-06-01 NOTE — Anesthesia Preprocedure Evaluation (Signed)
Anesthesia Evaluation  Patient identified by MRN, date of birth, ID band Patient awake    Reviewed: Allergy & Precautions, NPO status , Patient's Chart, lab work & pertinent test results  Airway Mallampati: II  TM Distance: >3 FB     Dental  (+) Edentulous Upper, Edentulous Lower   Pulmonary former smoker,    breath sounds clear to auscultation       Cardiovascular negative cardio ROS   Rhythm:Regular Rate:Normal     Neuro/Psych    GI/Hepatic GERD  ,  Endo/Other    Renal/GU      Musculoskeletal   Abdominal   Peds  Hematology   Anesthesia Other Findings   Reproductive/Obstetrics                             Anesthesia Physical Anesthesia Plan  ASA: II  Anesthesia Plan: MAC   Post-op Pain Management:    Induction: Intravenous  Airway Management Planned: Nasal Cannula  Additional Equipment:   Intra-op Plan:   Post-operative Plan:   Informed Consent: I have reviewed the patients History and Physical, chart, labs and discussed the procedure including the risks, benefits and alternatives for the proposed anesthesia with the patient or authorized representative who has indicated his/her understanding and acceptance.     Plan Discussed with:   Anesthesia Plan Comments:         Anesthesia Quick Evaluation

## 2016-06-01 NOTE — Anesthesia Procedure Notes (Signed)
Procedure Name: MAC Date/Time: 06/01/2016 9:40 AM Performed by: Vista Deck Pre-anesthesia Checklist: Patient identified, Emergency Drugs available, Suction available, Timeout performed and Patient being monitored Patient Re-evaluated:Patient Re-evaluated prior to inductionOxygen Delivery Method: Nasal Cannula

## 2016-06-03 ENCOUNTER — Encounter (HOSPITAL_COMMUNITY): Payer: Self-pay | Admitting: Ophthalmology

## 2016-06-08 DIAGNOSIS — H2512 Age-related nuclear cataract, left eye: Secondary | ICD-10-CM | POA: Diagnosis not present

## 2016-06-11 ENCOUNTER — Encounter (HOSPITAL_COMMUNITY): Payer: Self-pay

## 2016-06-11 ENCOUNTER — Encounter (HOSPITAL_COMMUNITY)
Admission: RE | Admit: 2016-06-11 | Discharge: 2016-06-11 | Disposition: A | Discharge status: Home / Self Care | Payer: Commercial Managed Care - HMO | Source: Ambulatory Visit | Attending: Ophthalmology | Admitting: Ophthalmology

## 2016-06-14 MED ORDER — KETOROLAC TROMETHAMINE 0.5 % OP SOLN
OPHTHALMIC | Status: AC
  Filled 2016-06-14: qty 5

## 2016-06-14 MED ORDER — CYCLOPENTOLATE-PHENYLEPHRINE 0.2-1 % OP SOLN
OPHTHALMIC | Status: AC
  Filled 2016-06-14: qty 2

## 2016-06-14 MED ORDER — TETRACAINE HCL 0.5 % OP SOLN
OPHTHALMIC | Status: AC
  Filled 2016-06-14: qty 4

## 2016-06-14 MED ORDER — PHENYLEPHRINE HCL 2.5 % OP SOLN
OPHTHALMIC | Status: AC
  Filled 2016-06-14: qty 15

## 2016-06-15 ENCOUNTER — Ambulatory Visit (HOSPITAL_COMMUNITY): Payer: Commercial Managed Care - HMO | Admitting: Anesthesiology

## 2016-06-15 ENCOUNTER — Encounter (HOSPITAL_COMMUNITY): Payer: Self-pay

## 2016-06-15 ENCOUNTER — Ambulatory Visit (HOSPITAL_COMMUNITY)
Admission: RE | Admit: 2016-06-15 | Discharge: 2016-06-15 | Disposition: A | Discharge status: Home / Self Care | Payer: Commercial Managed Care - HMO | Source: Ambulatory Visit | Attending: Ophthalmology | Admitting: Ophthalmology

## 2016-06-15 ENCOUNTER — Encounter (HOSPITAL_COMMUNITY)
Admission: RE | Disposition: A | Discharge status: Home / Self Care | Payer: Self-pay | Source: Ambulatory Visit | Attending: Ophthalmology

## 2016-06-15 DIAGNOSIS — Z79891 Long term (current) use of opiate analgesic: Secondary | ICD-10-CM | POA: Diagnosis not present

## 2016-06-15 DIAGNOSIS — Z87442 Personal history of urinary calculi: Secondary | ICD-10-CM | POA: Insufficient documentation

## 2016-06-15 DIAGNOSIS — K219 Gastro-esophageal reflux disease without esophagitis: Secondary | ICD-10-CM | POA: Insufficient documentation

## 2016-06-15 DIAGNOSIS — M1990 Unspecified osteoarthritis, unspecified site: Secondary | ICD-10-CM | POA: Insufficient documentation

## 2016-06-15 DIAGNOSIS — H409 Unspecified glaucoma: Secondary | ICD-10-CM | POA: Insufficient documentation

## 2016-06-15 DIAGNOSIS — M549 Dorsalgia, unspecified: Secondary | ICD-10-CM | POA: Diagnosis not present

## 2016-06-15 DIAGNOSIS — Z79899 Other long term (current) drug therapy: Secondary | ICD-10-CM | POA: Diagnosis not present

## 2016-06-15 DIAGNOSIS — Z791 Long term (current) use of non-steroidal anti-inflammatories (NSAID): Secondary | ICD-10-CM | POA: Diagnosis not present

## 2016-06-15 DIAGNOSIS — Z7982 Long term (current) use of aspirin: Secondary | ICD-10-CM | POA: Diagnosis not present

## 2016-06-15 DIAGNOSIS — G8929 Other chronic pain: Secondary | ICD-10-CM | POA: Diagnosis not present

## 2016-06-15 DIAGNOSIS — H2512 Age-related nuclear cataract, left eye: Secondary | ICD-10-CM | POA: Diagnosis not present

## 2016-06-15 DIAGNOSIS — Z87891 Personal history of nicotine dependence: Secondary | ICD-10-CM | POA: Diagnosis not present

## 2016-06-15 DIAGNOSIS — H269 Unspecified cataract: Secondary | ICD-10-CM | POA: Diagnosis not present

## 2016-06-15 DIAGNOSIS — H25012 Cortical age-related cataract, left eye: Secondary | ICD-10-CM | POA: Diagnosis not present

## 2016-06-15 HISTORY — PX: CATARACT EXTRACTION W/PHACO: SHX586

## 2016-06-15 SURGERY — PHACOEMULSIFICATION, CATARACT, WITH IOL INSERTION
Anesthesia: Monitor Anesthesia Care | Site: Eye | Laterality: Left

## 2016-06-15 MED ORDER — PHENYLEPHRINE HCL 2.5 % OP SOLN
1.0000 [drp] | OPHTHALMIC | Status: AC
  Administered 2016-06-15 (×3): 1 [drp] via OPHTHALMIC

## 2016-06-15 MED ORDER — CYCLOPENTOLATE-PHENYLEPHRINE 0.2-1 % OP SOLN
1.0000 [drp] | OPHTHALMIC | Status: AC
  Administered 2016-06-15 (×3): 1 [drp] via OPHTHALMIC

## 2016-06-15 MED ORDER — LACTATED RINGERS IV SOLN
INTRAVENOUS | Status: DC
  Administered 2016-06-15: 07:00:00 via INTRAVENOUS

## 2016-06-15 MED ORDER — LACTATED RINGERS IV SOLN
INTRAVENOUS | Status: DC | PRN
  Administered 2016-06-15: 07:00:00 via INTRAVENOUS

## 2016-06-15 MED ORDER — BSS IO SOLN
INTRAOCULAR | Status: DC | PRN
  Administered 2016-06-15: 15 mL

## 2016-06-15 MED ORDER — EPINEPHRINE PF 1 MG/ML IJ SOLN
INTRAOCULAR | Status: DC | PRN
  Administered 2016-06-15: 500 mL

## 2016-06-15 MED ORDER — KETOROLAC TROMETHAMINE 0.5 % OP SOLN
1.0000 [drp] | OPHTHALMIC | Status: AC
  Administered 2016-06-15 (×3): 1 [drp] via OPHTHALMIC

## 2016-06-15 MED ORDER — TETRACAINE HCL 0.5 % OP SOLN
1.0000 [drp] | OPHTHALMIC | Status: AC
  Administered 2016-06-15 (×3): 1 [drp] via OPHTHALMIC

## 2016-06-15 MED ORDER — FENTANYL CITRATE (PF) 100 MCG/2ML IJ SOLN
25.0000 ug | INTRAMUSCULAR | Status: AC | PRN
  Administered 2016-06-15 (×2): 25 ug via INTRAVENOUS

## 2016-06-15 MED ORDER — MIDAZOLAM HCL 2 MG/2ML IJ SOLN
1.0000 mg | INTRAMUSCULAR | Status: DC | PRN
  Administered 2016-06-15 (×2): 2 mg via INTRAVENOUS
  Filled 2016-06-15: qty 2

## 2016-06-15 MED ORDER — MIDAZOLAM HCL 2 MG/2ML IJ SOLN
INTRAMUSCULAR | Status: AC
  Filled 2016-06-15: qty 2

## 2016-06-15 MED ORDER — PROVISC 10 MG/ML IO SOLN
INTRAOCULAR | Status: DC | PRN
  Administered 2016-06-15: 0.85 mL via INTRAOCULAR

## 2016-06-15 MED ORDER — FENTANYL CITRATE (PF) 100 MCG/2ML IJ SOLN
INTRAMUSCULAR | Status: AC
  Filled 2016-06-15: qty 2

## 2016-06-15 MED ORDER — TETRACAINE 0.5 % OP SOLN OPTIME - NO CHARGE
OPHTHALMIC | Status: DC | PRN
  Administered 2016-06-15: 2 [drp] via OPHTHALMIC

## 2016-06-15 MED ORDER — LIDOCAINE HCL (PF) 1 % IJ SOLN
INTRAMUSCULAR | Status: AC
  Filled 2016-06-15: qty 2

## 2016-06-15 MED ORDER — EPINEPHRINE PF 1 MG/ML IJ SOLN
INTRAMUSCULAR | Status: AC
  Filled 2016-06-15: qty 1

## 2016-06-15 SURGICAL SUPPLY — 10 items

## 2016-06-15 NOTE — Discharge Instructions (Signed)
°  °          [facility] Instructions °[address]- [place] [address] °    ° °1. Avoid closing eyes tightly. One often closes the eye tightly when laughing, talking, sneezing, coughing or if they feel irritated. At these times, you should be careful not to close your eyes tightly. ° °2. Instill eye drops as instructed. To instill drops in your eye, open it, look up and have someone gently pull the lower lid down and instill a couple of drops inside the lower lid. ° °3. Do not touch upper lid. ° °4. Take Advil or Tylenol for pain. ° °5. You may use either eye for near work, such as reading or sewing and you may watch television. ° °6. You may have your hair done at the beauty parlor at any time. ° °7. Wear dark glasses with or without your own glasses if you are in bright light. ° °8. Call our office at [phone] or [phone] if you have sharp pain in your eye or unusual symptoms. ° °9.  FOLLOW UP WITH DR. SHAPIRO TODAY IN HIS [facility] OFFICE AT 2:45pm. ° °  °I have received a copy of the above instructions and will follow them.  ° ° ° °IF YOU ARE IN IMMEDIATE DANGER CALL 911! ° °It is important for you to keep your follow-up appointment with your physician after discharge, OR, for you /your caregiver to make a follow-up appointment with your physician / medical provider after discharge. ° °Show these instructions to the next healthcare provider you see. ° °

## 2016-06-15 NOTE — Op Note (Signed)
Patient brought to the operating room and prepped and draped in the usual manner.  Lid speculum inserted in left eye.  Stab incision made at the twelve o'clock position.  Provisc instilled in the anterior chamber.   A 2.4 mm. Stab incision was made temporally.  An anterior capsulotomy was done with a bent 25 gauge needle.  The nucleus was hydrodissected.  The Phaco tip was inserted in the anterior chamber and the nucleus was emulsified.  CDE was 4.37.  The cortical material was then removed with the I and A tip.  Posterior capsule was the polished.  The anterior chamber was deepened with Provisc.  A 8.5 Diopter Alcon AU00T0 IOL was then inserted in the capsular bag.  Provisc was then removed with the I and A tip.  The wound was then hydrated.  Patient sent to the Recovery Room in good condition with follow up in my office.  Preoperative Diagnosis:  Nuclear Cataract OS Postoperative Diagnosis:  Same Procedure name: Kelman Phacoemulsification OS with IOL

## 2016-06-15 NOTE — H&P (Signed)
The patient was re examined and there is no change in the patients condition since the original H and P. 

## 2016-06-15 NOTE — Transfer of Care (Signed)
Immediate Anesthesia Transfer of Care Note  Patient: Steven Richard  Procedure(s) Performed: Procedure(s) with comments: CATARACT EXTRACTION PHACO AND INTRAOCULAR LENS PLACEMENT (IOC) (Left) - CDE: 4.37  Patient Location: PACU  Anesthesia Type:MAC  Level of Consciousness: awake, alert , oriented and patient cooperative  Airway & Oxygen Therapy: Patient Spontanous Breathing  Post-op Assessment: Report given to RN, Post -op Vital signs reviewed and stable and Patient moving all extremities X 4  Post vital signs: Reviewed and stable  Last Vitals:  Vitals:   06/15/16 0715 06/15/16 0720  BP: 136/79 120/71  Pulse:    Resp: (!) 27 16  Temp:      Last Pain:  Vitals:   06/15/16 0628  PainSc: 0-No pain      Patients Stated Pain Goal: 8 ([id number])  Complications: No apparent anesthesia complications

## 2016-06-15 NOTE — Anesthesia Preprocedure Evaluation (Signed)
Anesthesia Evaluation  Patient identified by MRN, date of birth, ID band Patient awake    Reviewed: Allergy & Precautions, NPO status , Patient's Chart, lab work & pertinent test results  Airway Mallampati: II  TM Distance: >3 FB     Dental  (+) Edentulous Upper, Edentulous Lower   Pulmonary former smoker,    breath sounds clear to auscultation       Cardiovascular negative cardio ROS   Rhythm:Regular Rate:Normal     Neuro/Psych    GI/Hepatic GERD  ,  Endo/Other    Renal/GU      Musculoskeletal   Abdominal   Peds  Hematology   Anesthesia Other Findings   Reproductive/Obstetrics                             Anesthesia Physical Anesthesia Plan  ASA: II  Anesthesia Plan: MAC   Post-op Pain Management:    Induction: Intravenous  Airway Management Planned: Nasal Cannula  Additional Equipment:   Intra-op Plan:   Post-operative Plan:   Informed Consent: I have reviewed the patients History and Physical, chart, labs and discussed the procedure including the risks, benefits and alternatives for the proposed anesthesia with the patient or authorized representative who has indicated his/her understanding and acceptance.     Plan Discussed with:   Anesthesia Plan Comments:         Anesthesia Quick Evaluation

## 2016-06-15 NOTE — Anesthesia Postprocedure Evaluation (Signed)
Anesthesia Post Note  Patient: Steven Richard  Procedure(s) Performed: Procedure(s) (LRB): CATARACT EXTRACTION PHACO AND INTRAOCULAR LENS PLACEMENT (IOC) (Left)  Patient location during evaluation: PACU Anesthesia Type: MAC Level of consciousness: awake and alert Pain management: pain level controlled Vital Signs Assessment: post-procedure vital signs reviewed and stable Respiratory status: spontaneous breathing, nonlabored ventilation, respiratory function stable and patient connected to nasal cannula oxygen Cardiovascular status: stable and blood pressure returned to baseline Anesthetic complications: no    Last Vitals:  Vitals:   06/15/16 0720 06/15/16 0752  BP: 120/71 122/75  Pulse:  66  Resp: 16 16  Temp:  36.3 C    Last Pain:  Vitals:   06/15/16 0752  TempSrc: Oral  PainSc: 0-No pain                 Naryiah Schley

## 2016-06-17 ENCOUNTER — Encounter (HOSPITAL_COMMUNITY): Payer: Self-pay | Admitting: Ophthalmology

## 2016-06-17 NOTE — Addendum Note (Signed)
Addendum  created 06/17/16 1516 by Vista Deck, CRNA   Anesthesia Staff edited

## 2016-06-22 DIAGNOSIS — Z79899 Other long term (current) drug therapy: Secondary | ICD-10-CM | POA: Diagnosis not present

## 2016-06-22 DIAGNOSIS — M25519 Pain in unspecified shoulder: Secondary | ICD-10-CM | POA: Diagnosis not present

## 2016-06-22 DIAGNOSIS — M545 Low back pain: Secondary | ICD-10-CM | POA: Diagnosis not present

## 2016-06-22 DIAGNOSIS — R252 Cramp and spasm: Secondary | ICD-10-CM | POA: Diagnosis not present

## 2016-06-22 DIAGNOSIS — G894 Chronic pain syndrome: Secondary | ICD-10-CM | POA: Diagnosis not present

## 2016-06-22 DIAGNOSIS — M5416 Radiculopathy, lumbar region: Secondary | ICD-10-CM | POA: Diagnosis not present

## 2016-06-22 DIAGNOSIS — K219 Gastro-esophageal reflux disease without esophagitis: Secondary | ICD-10-CM | POA: Diagnosis not present

## 2016-06-22 DIAGNOSIS — M2550 Pain in unspecified joint: Secondary | ICD-10-CM | POA: Diagnosis not present

## 2016-06-22 DIAGNOSIS — R635 Abnormal weight gain: Secondary | ICD-10-CM | POA: Diagnosis not present

## 2016-07-23 DIAGNOSIS — Z961 Presence of intraocular lens: Secondary | ICD-10-CM | POA: Diagnosis not present

## 2016-07-23 DIAGNOSIS — H52223 Regular astigmatism, bilateral: Secondary | ICD-10-CM | POA: Diagnosis not present

## 2016-09-15 DIAGNOSIS — M545 Low back pain: Secondary | ICD-10-CM | POA: Diagnosis not present

## 2016-09-15 DIAGNOSIS — R252 Cramp and spasm: Secondary | ICD-10-CM | POA: Diagnosis not present

## 2016-09-15 DIAGNOSIS — K219 Gastro-esophageal reflux disease without esophagitis: Secondary | ICD-10-CM | POA: Diagnosis not present

## 2016-09-15 DIAGNOSIS — M5416 Radiculopathy, lumbar region: Secondary | ICD-10-CM | POA: Diagnosis not present

## 2016-09-15 DIAGNOSIS — R635 Abnormal weight gain: Secondary | ICD-10-CM | POA: Diagnosis not present

## 2016-09-15 DIAGNOSIS — Z79899 Other long term (current) drug therapy: Secondary | ICD-10-CM | POA: Diagnosis not present

## 2016-09-15 DIAGNOSIS — M2550 Pain in unspecified joint: Secondary | ICD-10-CM | POA: Diagnosis not present

## 2016-09-15 DIAGNOSIS — G894 Chronic pain syndrome: Secondary | ICD-10-CM | POA: Diagnosis not present

## 2016-09-15 DIAGNOSIS — M25519 Pain in unspecified shoulder: Secondary | ICD-10-CM | POA: Diagnosis not present

## 2016-09-22 DIAGNOSIS — G894 Chronic pain syndrome: Secondary | ICD-10-CM | POA: Diagnosis not present

## 2016-09-22 DIAGNOSIS — L732 Hidradenitis suppurativa: Secondary | ICD-10-CM | POA: Diagnosis not present

## 2016-09-22 DIAGNOSIS — Z6832 Body mass index (BMI) 32.0-32.9, adult: Secondary | ICD-10-CM | POA: Diagnosis not present

## 2016-09-22 DIAGNOSIS — F419 Anxiety disorder, unspecified: Secondary | ICD-10-CM | POA: Diagnosis not present

## 2016-09-22 DIAGNOSIS — M25512 Pain in left shoulder: Secondary | ICD-10-CM | POA: Diagnosis not present

## 2016-10-21 DIAGNOSIS — M25512 Pain in left shoulder: Secondary | ICD-10-CM | POA: Diagnosis not present

## 2016-10-21 DIAGNOSIS — Z6832 Body mass index (BMI) 32.0-32.9, adult: Secondary | ICD-10-CM | POA: Diagnosis not present

## 2016-10-21 DIAGNOSIS — M7502 Adhesive capsulitis of left shoulder: Secondary | ICD-10-CM | POA: Diagnosis not present

## 2016-11-11 DIAGNOSIS — M542 Cervicalgia: Secondary | ICD-10-CM | POA: Diagnosis not present

## 2016-11-11 DIAGNOSIS — M545 Low back pain: Secondary | ICD-10-CM | POA: Diagnosis not present

## 2016-11-11 DIAGNOSIS — G894 Chronic pain syndrome: Secondary | ICD-10-CM | POA: Diagnosis not present

## 2016-11-11 DIAGNOSIS — Z79899 Other long term (current) drug therapy: Secondary | ICD-10-CM | POA: Diagnosis not present

## 2016-11-11 DIAGNOSIS — R635 Abnormal weight gain: Secondary | ICD-10-CM | POA: Diagnosis not present

## 2016-11-11 DIAGNOSIS — M2550 Pain in unspecified joint: Secondary | ICD-10-CM | POA: Diagnosis not present

## 2016-11-11 DIAGNOSIS — R252 Cramp and spasm: Secondary | ICD-10-CM | POA: Diagnosis not present

## 2016-11-11 DIAGNOSIS — M25519 Pain in unspecified shoulder: Secondary | ICD-10-CM | POA: Diagnosis not present

## 2016-11-11 DIAGNOSIS — M5416 Radiculopathy, lumbar region: Secondary | ICD-10-CM | POA: Diagnosis not present

## 2016-11-25 DIAGNOSIS — R635 Abnormal weight gain: Secondary | ICD-10-CM | POA: Diagnosis not present

## 2016-11-25 DIAGNOSIS — M2550 Pain in unspecified joint: Secondary | ICD-10-CM | POA: Diagnosis not present

## 2016-11-25 DIAGNOSIS — K219 Gastro-esophageal reflux disease without esophagitis: Secondary | ICD-10-CM | POA: Diagnosis not present

## 2016-11-25 DIAGNOSIS — R252 Cramp and spasm: Secondary | ICD-10-CM | POA: Diagnosis not present

## 2016-11-25 DIAGNOSIS — M5416 Radiculopathy, lumbar region: Secondary | ICD-10-CM | POA: Diagnosis not present

## 2016-11-25 DIAGNOSIS — M25519 Pain in unspecified shoulder: Secondary | ICD-10-CM | POA: Diagnosis not present

## 2016-11-25 DIAGNOSIS — Z79899 Other long term (current) drug therapy: Secondary | ICD-10-CM | POA: Diagnosis not present

## 2016-11-25 DIAGNOSIS — G894 Chronic pain syndrome: Secondary | ICD-10-CM | POA: Diagnosis not present

## 2016-11-25 DIAGNOSIS — M545 Low back pain: Secondary | ICD-10-CM | POA: Diagnosis not present

## 2016-12-21 DIAGNOSIS — Z961 Presence of intraocular lens: Secondary | ICD-10-CM | POA: Diagnosis not present

## 2016-12-27 DIAGNOSIS — Z6832 Body mass index (BMI) 32.0-32.9, adult: Secondary | ICD-10-CM | POA: Diagnosis not present

## 2016-12-27 DIAGNOSIS — Z79899 Other long term (current) drug therapy: Secondary | ICD-10-CM | POA: Diagnosis not present

## 2016-12-27 DIAGNOSIS — F411 Generalized anxiety disorder: Secondary | ICD-10-CM | POA: Diagnosis not present

## 2016-12-27 DIAGNOSIS — M25512 Pain in left shoulder: Secondary | ICD-10-CM | POA: Diagnosis not present

## 2016-12-27 DIAGNOSIS — M25511 Pain in right shoulder: Secondary | ICD-10-CM | POA: Diagnosis not present

## 2016-12-27 DIAGNOSIS — R739 Hyperglycemia, unspecified: Secondary | ICD-10-CM | POA: Diagnosis not present

## 2016-12-27 DIAGNOSIS — Z0000 Encounter for general adult medical examination without abnormal findings: Secondary | ICD-10-CM | POA: Diagnosis not present

## 2016-12-27 DIAGNOSIS — G894 Chronic pain syndrome: Secondary | ICD-10-CM | POA: Diagnosis not present

## 2016-12-27 DIAGNOSIS — Z0001 Encounter for general adult medical examination with abnormal findings: Secondary | ICD-10-CM | POA: Diagnosis not present

## 2016-12-27 DIAGNOSIS — E039 Hypothyroidism, unspecified: Secondary | ICD-10-CM | POA: Diagnosis not present

## 2017-01-11 DIAGNOSIS — M25512 Pain in left shoulder: Secondary | ICD-10-CM | POA: Diagnosis not present

## 2017-01-11 DIAGNOSIS — M5416 Radiculopathy, lumbar region: Secondary | ICD-10-CM | POA: Diagnosis not present

## 2017-01-11 DIAGNOSIS — M545 Low back pain: Secondary | ICD-10-CM | POA: Diagnosis not present

## 2017-01-11 DIAGNOSIS — Z79891 Long term (current) use of opiate analgesic: Secondary | ICD-10-CM | POA: Diagnosis not present

## 2017-01-25 DIAGNOSIS — M25512 Pain in left shoulder: Secondary | ICD-10-CM | POA: Diagnosis not present

## 2017-01-25 DIAGNOSIS — M7552 Bursitis of left shoulder: Secondary | ICD-10-CM | POA: Diagnosis not present

## 2017-01-25 DIAGNOSIS — M542 Cervicalgia: Secondary | ICD-10-CM | POA: Diagnosis not present

## 2017-01-25 DIAGNOSIS — M50122 Cervical disc disorder at C5-C6 level with radiculopathy: Secondary | ICD-10-CM | POA: Diagnosis not present

## 2017-02-01 DIAGNOSIS — M50122 Cervical disc disorder at C5-C6 level with radiculopathy: Secondary | ICD-10-CM | POA: Diagnosis not present

## 2017-02-01 DIAGNOSIS — S199XXA Unspecified injury of neck, initial encounter: Secondary | ICD-10-CM | POA: Diagnosis not present

## 2017-02-01 DIAGNOSIS — M47816 Spondylosis without myelopathy or radiculopathy, lumbar region: Secondary | ICD-10-CM | POA: Diagnosis not present

## 2017-02-15 DIAGNOSIS — M25512 Pain in left shoulder: Secondary | ICD-10-CM | POA: Diagnosis not present

## 2017-02-18 DIAGNOSIS — M25512 Pain in left shoulder: Secondary | ICD-10-CM | POA: Diagnosis not present

## 2017-02-18 DIAGNOSIS — S4992XA Unspecified injury of left shoulder and upper arm, initial encounter: Secondary | ICD-10-CM | POA: Diagnosis not present

## 2017-02-18 DIAGNOSIS — M7552 Bursitis of left shoulder: Secondary | ICD-10-CM | POA: Diagnosis not present

## 2017-02-18 DIAGNOSIS — M7592 Shoulder lesion, unspecified, left shoulder: Secondary | ICD-10-CM | POA: Diagnosis not present

## 2017-02-22 DIAGNOSIS — M7592 Shoulder lesion, unspecified, left shoulder: Secondary | ICD-10-CM | POA: Diagnosis not present

## 2017-02-22 DIAGNOSIS — M25512 Pain in left shoulder: Secondary | ICD-10-CM | POA: Diagnosis not present

## 2017-03-11 DIAGNOSIS — Z79899 Other long term (current) drug therapy: Secondary | ICD-10-CM | POA: Diagnosis not present

## 2017-03-11 DIAGNOSIS — M5416 Radiculopathy, lumbar region: Secondary | ICD-10-CM | POA: Diagnosis not present

## 2017-03-11 DIAGNOSIS — R252 Cramp and spasm: Secondary | ICD-10-CM | POA: Diagnosis not present

## 2017-03-11 DIAGNOSIS — M2550 Pain in unspecified joint: Secondary | ICD-10-CM | POA: Diagnosis not present

## 2017-03-11 DIAGNOSIS — M25519 Pain in unspecified shoulder: Secondary | ICD-10-CM | POA: Diagnosis not present

## 2017-03-11 DIAGNOSIS — M545 Low back pain: Secondary | ICD-10-CM | POA: Diagnosis not present

## 2017-03-11 DIAGNOSIS — R635 Abnormal weight gain: Secondary | ICD-10-CM | POA: Diagnosis not present

## 2017-03-11 DIAGNOSIS — K219 Gastro-esophageal reflux disease without esophagitis: Secondary | ICD-10-CM | POA: Diagnosis not present

## 2017-03-11 DIAGNOSIS — G894 Chronic pain syndrome: Secondary | ICD-10-CM | POA: Diagnosis not present

## 2017-03-21 DIAGNOSIS — M25512 Pain in left shoulder: Secondary | ICD-10-CM | POA: Diagnosis not present

## 2017-04-27 DIAGNOSIS — M7542 Impingement syndrome of left shoulder: Secondary | ICD-10-CM | POA: Diagnosis not present

## 2017-05-06 DIAGNOSIS — G8918 Other acute postprocedural pain: Secondary | ICD-10-CM | POA: Diagnosis not present

## 2017-05-06 DIAGNOSIS — M19012 Primary osteoarthritis, left shoulder: Secondary | ICD-10-CM | POA: Diagnosis not present

## 2017-05-06 DIAGNOSIS — S46092A Other injury of muscle(s) and tendon(s) of the rotator cuff of left shoulder, initial encounter: Secondary | ICD-10-CM | POA: Diagnosis not present

## 2017-05-06 DIAGNOSIS — M24112 Other articular cartilage disorders, left shoulder: Secondary | ICD-10-CM | POA: Diagnosis not present

## 2017-05-06 DIAGNOSIS — S43432A Superior glenoid labrum lesion of left shoulder, initial encounter: Secondary | ICD-10-CM | POA: Diagnosis not present

## 2017-05-06 DIAGNOSIS — M7542 Impingement syndrome of left shoulder: Secondary | ICD-10-CM | POA: Diagnosis not present

## 2017-05-06 DIAGNOSIS — M75122 Complete rotator cuff tear or rupture of left shoulder, not specified as traumatic: Secondary | ICD-10-CM | POA: Diagnosis not present

## 2017-05-10 DIAGNOSIS — M542 Cervicalgia: Secondary | ICD-10-CM | POA: Diagnosis not present

## 2017-05-10 DIAGNOSIS — M545 Low back pain: Secondary | ICD-10-CM | POA: Diagnosis not present

## 2017-05-10 DIAGNOSIS — M5416 Radiculopathy, lumbar region: Secondary | ICD-10-CM | POA: Diagnosis not present

## 2017-05-10 DIAGNOSIS — Z79891 Long term (current) use of opiate analgesic: Secondary | ICD-10-CM | POA: Diagnosis not present

## 2017-05-10 DIAGNOSIS — M25512 Pain in left shoulder: Secondary | ICD-10-CM | POA: Diagnosis not present

## 2017-07-08 DIAGNOSIS — M5416 Radiculopathy, lumbar region: Secondary | ICD-10-CM | POA: Diagnosis not present

## 2017-07-08 DIAGNOSIS — M545 Low back pain: Secondary | ICD-10-CM | POA: Diagnosis not present

## 2017-07-08 DIAGNOSIS — M25512 Pain in left shoulder: Secondary | ICD-10-CM | POA: Diagnosis not present

## 2017-07-08 DIAGNOSIS — Z79891 Long term (current) use of opiate analgesic: Secondary | ICD-10-CM | POA: Diagnosis not present

## 2017-07-11 DIAGNOSIS — H401132 Primary open-angle glaucoma, bilateral, moderate stage: Secondary | ICD-10-CM | POA: Diagnosis not present

## 2017-08-02 DIAGNOSIS — G894 Chronic pain syndrome: Secondary | ICD-10-CM | POA: Diagnosis not present

## 2017-08-02 DIAGNOSIS — F419 Anxiety disorder, unspecified: Secondary | ICD-10-CM | POA: Diagnosis not present

## 2017-08-02 DIAGNOSIS — F411 Generalized anxiety disorder: Secondary | ICD-10-CM | POA: Diagnosis not present

## 2017-09-02 DIAGNOSIS — Z79891 Long term (current) use of opiate analgesic: Secondary | ICD-10-CM | POA: Diagnosis not present

## 2017-09-02 DIAGNOSIS — M545 Low back pain: Secondary | ICD-10-CM | POA: Diagnosis not present

## 2017-09-02 DIAGNOSIS — M25512 Pain in left shoulder: Secondary | ICD-10-CM | POA: Diagnosis not present

## 2017-09-02 DIAGNOSIS — M5416 Radiculopathy, lumbar region: Secondary | ICD-10-CM | POA: Diagnosis not present

## 2017-09-07 DIAGNOSIS — Z681 Body mass index (BMI) 19 or less, adult: Secondary | ICD-10-CM | POA: Diagnosis not present

## 2017-09-07 DIAGNOSIS — R42 Dizziness and giddiness: Secondary | ICD-10-CM | POA: Diagnosis not present

## 2017-10-28 DIAGNOSIS — M5416 Radiculopathy, lumbar region: Secondary | ICD-10-CM | POA: Diagnosis not present

## 2017-10-28 DIAGNOSIS — M25512 Pain in left shoulder: Secondary | ICD-10-CM | POA: Diagnosis not present

## 2017-10-28 DIAGNOSIS — Z79891 Long term (current) use of opiate analgesic: Secondary | ICD-10-CM | POA: Diagnosis not present

## 2017-10-28 DIAGNOSIS — M545 Low back pain: Secondary | ICD-10-CM | POA: Diagnosis not present

## 2017-12-30 DIAGNOSIS — M25512 Pain in left shoulder: Secondary | ICD-10-CM | POA: Diagnosis not present

## 2017-12-30 DIAGNOSIS — M5416 Radiculopathy, lumbar region: Secondary | ICD-10-CM | POA: Diagnosis not present

## 2017-12-30 DIAGNOSIS — Z79891 Long term (current) use of opiate analgesic: Secondary | ICD-10-CM | POA: Diagnosis not present

## 2017-12-30 DIAGNOSIS — M545 Low back pain: Secondary | ICD-10-CM | POA: Diagnosis not present

## 2018-01-10 DIAGNOSIS — H538 Other visual disturbances: Secondary | ICD-10-CM | POA: Diagnosis not present

## 2018-01-10 DIAGNOSIS — Z961 Presence of intraocular lens: Secondary | ICD-10-CM | POA: Diagnosis not present

## 2018-01-10 DIAGNOSIS — H401132 Primary open-angle glaucoma, bilateral, moderate stage: Secondary | ICD-10-CM | POA: Diagnosis not present

## 2018-01-17 NOTE — Progress Notes (Signed)
[facility] Note  01/18/2018     CHIEF COMPLAINT Patient presents for Retina Evaluation   HISTORY OF PRESENT ILLNESS: Steven Richard is a 62 y.o. male who presents to the clinic today for:   HPI    Retina Evaluation    In both eyes.  This started 1 month ago.  Associated Symptoms Floaters and Photophobia.  Negative for Flashes, Blind Spot, Scalp Tenderness, Fever, Weight Loss, Jaw Claudication, Glare, Pain, Distortion, Redness, Trauma, Shoulder/Hip pain and Fatigue.  Context:  distance vision, mid-range vision and near vision.  Treatments tried include surgery.  Response to treatment was mild improvement.  I, the attending physician,  performed the HPI with the patient and updated documentation appropriately.          Comments    Referral of DR. Gershon Crane for retina evaluation. Patient states he has had floaters ou for years, his right eye is" worse and weaker than his left". Pt reports he had glaucoma ou and cataracts ou, he had cataract sx ou appx six months ago,with lens implants, his vision had improved until about month ago, his feels as if film is covering them per patient. Pt states he wears sun glasses while outside due to light sensitivity. Pt is using Dorzolamide gtt's Bid ou, Travoprost @ Hs OU.         Last edited by Bernarda Caffey, MD on 01/18/2018  8:55 AM. (History)    Pt reports using travatan OU qhs and dorz OU BID  Referring physician: Rutherford Guys, MD [place], [place] [address]  HISTORICAL INFORMATION:   Selected notes from the MEDICAL RECORD NUMBER Referred by Dr. Rutherford Guys for concern of blurred vision and possible macula changes LEE: 07.09.19 Jake Samples) [BCVA: OD: 20/40 OS: 20/30] Ocular Hx-POAG OU, Pseudophakia OU PMH-arthritis, former smoker    CURRENT MEDICATIONS: Current Outpatient Medications (Ophthalmic Drugs)  Medication Sig  . dorzolamide (TRUSOPT) 2 % ophthalmic solution Place 1 drop into both  eyes 2 (two) times daily.  . Travoprost, BAK Free, (TRAVATAN) 0.004 % SOLN ophthalmic solution Place 1 drop into both eyes at bedtime.   No current facility-administered medications for this visit.  (Ophthalmic Drugs)   Current Outpatient Medications (Other)  Medication Sig  . ALPRAZolam (XANAX) 1 MG tablet Take 1 mg by mouth daily.  Marland Kitchen aspirin EC 81 MG tablet Take 81 mg by mouth daily.  Marland Kitchen doxycycline (VIBRA-TABS) 100 MG tablet Take 1 tablet by mouth 2 (two) times daily.  . DULoxetine (CYMBALTA) 60 MG capsule Take 60 mg by mouth daily.  Marland Kitchen esomeprazole (NEXIUM) 40 MG capsule Take 40 mg by mouth daily at 12 noon.  . lubiprostone (AMITIZA) 24 MCG capsule Take 24 mcg by mouth 2 (two) times daily with a meal.  . meloxicam (MOBIC) 15 MG tablet Take 15 mg by mouth daily.  Marland Kitchen oxyCODONE-acetaminophen (PERCOCET) 10-325 MG tablet Take 1 tablet by mouth 2 (two) times daily.  Marland Kitchen PARoxetine (PAXIL) 20 MG tablet Take 1 tablet by mouth daily.  . pregabalin (LYRICA) 300 MG capsule Take 1 capsule by mouth 2 (two) times daily.   . traZODone (DESYREL) 100 MG tablet Take 1 tablet by mouth at bedtime.   No current facility-administered medications for this visit.  (Other)      REVIEW OF SYSTEMS: ROS    Positive for: Eyes   Negative for: Constitutional, Gastrointestinal, Neurological, Skin, Genitourinary, Musculoskeletal, HENT, Endocrine, Cardiovascular, Respiratory, Psychiatric, Allergic/Imm, Heme/Lymph   Last edited  by Zenovia Jordan, LPN on 9/92/4268  3:41 AM. (History)       ALLERGIES No Known Allergies  PAST MEDICAL HISTORY Past Medical History:  Diagnosis Date  . Arthritis   . Chronic back pain   . GERD (gastroesophageal reflux disease)   . Glaucoma   . History of kidney stones    history of   Past Surgical History:  Procedure Laterality Date  . CATARACT EXTRACTION W/PHACO Right 06/01/2016   Procedure: CATARACT EXTRACTION PHACO AND INTRAOCULAR LENS PLACEMENT (IOC);  Surgeon: Rutherford Guys, MD;  Location: [facility];  Service: Ophthalmology;  Laterality: Right;  CDE:  4.34  . CATARACT EXTRACTION W/PHACO Left 06/15/2016   Procedure: CATARACT EXTRACTION PHACO AND INTRAOCULAR LENS PLACEMENT (IOC);  Surgeon: Rutherford Guys, MD;  Location: [facility];  Service: Ophthalmology;  Laterality: Left;  CDE: 4.37  . EYE SURGERY    . ORCHIECTOMY Left    ruptured testicle  . TYMPANOPLASTY Right    with pathching    FAMILY HISTORY Family History  Problem Relation Age of Onset  . Diabetes Mother   . Dementia Mother   . Diabetes Father     SOCIAL HISTORY Social History   Tobacco Use  . Smoking status: Former Smoker    Packs/day: 1.00    Years: 5.00    Pack years: 5.00    Types: Cigarettes    Last attempt to quit: 05/26/1968    Years since quitting: 49.6  . Smokeless tobacco: Never Used  Substance Use Topics  . Alcohol use: No  . Drug use: No         OPHTHALMIC EXAM:  Base Eye Exam    Visual Acuity (Snellen - Linear)      Right Left   Dist cc 20/50 20/40   Dist ph cc 20/40 NI   Correction:  Glasses       Tonometry (Tonopen, 8:35 AM)      Right Left   Pressure 16 17       Pupils      Dark Light Shape React APD   Right 4 3 Round Brisk None   Left 4 3 Round Brisk None       Extraocular Movement      Right Left    Full, Ortho Full, Ortho       Neuro/Psych    Oriented x3:  Yes   Mood/Affect:  Normal       Dilation    Both eyes:  1.0% Mydriacyl, 2.5% Phenylephrine @ 8:35 AM        Slit Lamp and Fundus Exam    Slit Lamp Exam      Right Left   Lids/Lashes Dermatochalasis - upper lid Dermatochalasis - upper lid   Conjunctiva/Sclera White and quiet White and quiet   Cornea Arcus, Temporal Well healed cataract wounds Arcus, Temporal Well healed cataract wounds   Anterior Chamber Deep and quiet, no cell or flare Deep and quiet, no cell or flare   Iris Round and dilated Round and dilated   Lens PC IOL in good position, Inferior 1+ Posterior capsular  opacification PC IOL in good position, trace IN PCO   Vitreous Vitreous syneresis, Posterior vitreous detachment Vitreous syneresis, Posterior vitreous detachment       Fundus Exam      Right Left   Disc Anomalous/tilted disc, Pallor, Thin rim, especially thin inferiorly, Peripapillary atrophy Anomalous/tilted disc, Thin rim, Inferior-nasal Peripapillary atrophy   C/D Ratio 0.9    Macula Flat,  Blunted foveal reflex, RPE mottling and clumping, No heme or edema Blunted foveal reflex, RPE mottling and clumping, No heme or edema   Vessels Mild Vascular attenuation, mild AV crossing changes Mild Vascular attenuation, mild AV crossing changes   Periphery Attached, very blonde fundus, scattered pigmented cobblestoning inferiorly, No RT/RD Attached, very blonde fundus, inferior cobblestoning , No RT/RD        Refraction    Wearing Rx      Sphere Cylinder Axis   Right +0.25 +1.50 100   Left +0.25 +1.75 100       Manifest Refraction      Sphere Cylinder Axis Dist VA   Right -0.50 +2.00 125 20/40   Left -0.50 +1.75 090 20/30-1          IMAGING AND PROCEDURES  Imaging and Procedures for @TODAY @  OCT, Retina - OU - Both Eyes       Right Eye Quality was good. Central Foveal Thickness: 293. Progression has no prior data. Findings include normal foveal contour, no IRF, no SRF (Abnormal scleral contour and thin choroid ).   Left Eye Quality was good. Central Foveal Thickness: 296. Progression has no prior data. Findings include normal foveal contour, no IRF, no SRF (Abnormal scleral contour).   Notes *Images captured and stored on drive  Diagnosis / Impression:  NFP, No IRF/SRF, abnormal scleral (myopic) contour  Clinical management:  See below  Abbreviations: NFP - Normal foveal profile. CME - cystoid macular edema. PED - pigment epithelial detachment. IRF - intraretinal fluid. SRF - subretinal fluid. EZ - ellipsoid zone. ERM - epiretinal membrane. ORA - outer retinal atrophy. ORT  - outer retinal tubulation. SRHM - subretinal hyper-reflective material         Humphrey Visual Field - OU - Both Eyes       Right Eye Reliability was good. Progression has no prior data. Foveal threshold was reduced. Findings include superior altitudinal defect.   Left Eye Reliability was borderline. Progression has no prior data. Findings include non-specific defects.   Notes Images stored on drive                ASSESSMENT/PLAN:    ICD-10-CM   1. Degenerative myopia of both eyes H44.23   2. Primary open angle glaucoma (POAG) of both eyes, severe stage H40.1133 Humphrey Visual Field - OU - Both Eyes  3. Posterior vitreous detachment of both eyes H43.813   4. Retinal edema H35.81 OCT, Retina - OU - Both Eyes  5. Pseudophakia of both eyes Z96.1     1. Myopic degeneration OU-  - no significant macular changes corresponding to recent vision changes OD -- suspect symptoms related to progressive nerve damage from POAG OD -- see below - scattered peripheral chorioretinal changes OU - no retinal tears or detachments - F/U 1 year  2. 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 Gershon Crane  3. PVD / vitreous syneresis OU  Discussed findings and prognosis  No RT or RD on 360 peripheral exam  Reviewed s/s of RT/RD  Strict return precautions for any such RT/RD signs/symptoms  4. No retinal edema on exam or OCT  5. Pseudophakia OU  - s/p CE/IOL OU  -  beautiful surgeries, doing well  - monitor   Ophthalmic Meds Ordered this visit:  No orders of the defined types were placed in this encounter.      Return in about 1 year (around 01/19/2019) for F/U myopic degen OU, DFE,  OCT.  There are no Patient Instructions on file for this visit.   Explained the diagnoses, plan, and follow up with the patient and they expressed understanding.  Patient expressed understanding of the importance of proper follow up care.   This document serves as a record of services personally performed by Gardiner Sleeper, MD, PhD. It was created on their behalf by Ernest Mallick, OA, an ophthalmic assistant. The creation of this record is the provider's dictation and/or activities during the visit.    Electronically signed by: Ernest Mallick, OA  07.16.2019 1:06 PM   This document serves as a record of services personally performed by Gardiner Sleeper, MD, PhD. It was created on their behalf by Catha Brow, [place], a certified ophthalmic assistant. The creation of this record is the provider's dictation and/or activities during the visit.  Electronically signed by: Catha Brow, [place]  07.17.19 1:06 PM   Gardiner Sleeper, M.D., Ph.D. Diseases & Surgery of the Retina and Vitreous [facility]   I have reviewed the above documentation for accuracy and completeness, and I agree with the above. Gardiner Sleeper, M.D., Ph.D. 01/18/18 1:06 PM   Abbreviations: M myopia (nearsighted); A astigmatism; H hyperopia (farsighted); P presbyopia; Mrx spectacle prescription;  CTL contact lenses; OD right eye; OS left eye; OU both eyes  XT exotropia; ET esotropia; PEK punctate epithelial keratitis; PEE punctate epithelial erosions; DES dry eye syndrome; MGD meibomian gland dysfunction; ATs artificial tears; PFAT's preservative free artificial tears; [place] nuclear sclerotic cataract; PSC posterior subcapsular cataract; ERM epi-retinal membrane; PVD posterior vitreous detachment; RD retinal detachment; DM diabetes mellitus; DR diabetic retinopathy; NPDR non-proliferative diabetic retinopathy; PDR proliferative diabetic retinopathy; CSME clinically significant macular edema; DME diabetic  macular edema; dbh dot blot hemorrhages; CWS cotton wool spot; POAG primary open angle glaucoma; C/D cup-to-disc ratio; HVF humphrey visual field; GVF goldmann visual field; OCT optical coherence tomography; IOP intraocular pressure; BRVO Branch retinal vein occlusion; CRVO central retinal vein occlusion; CRAO central retinal artery occlusion; BRAO branch retinal artery occlusion; RT retinal tear; SB scleral buckle; PPV pars plana vitrectomy; VH Vitreous hemorrhage; PRP panretinal laser photocoagulation; IVK intravitreal kenalog; VMT vitreomacular traction; MH Macular hole;  NVD neovascularization of the disc; NVE neovascularization elsewhere; AREDS age related eye disease study; ARMD age related macular degeneration; POAG primary open angle glaucoma; EBMD epithelial/anterior basement membrane dystrophy; ACIOL anterior chamber intraocular lens; IOL intraocular lens; PCIOL posterior chamber intraocular lens; Phaco/IOL phacoemulsification with intraocular lens placement; [place] photorefractive keratectomy; LASIK laser assisted in situ keratomileusis; HTN hypertension; DM diabetes mellitus; COPD chronic obstructive pulmonary disease

## 2018-01-18 ENCOUNTER — Encounter (INDEPENDENT_AMBULATORY_CARE_PROVIDER_SITE_OTHER): Payer: Self-pay | Admitting: Ophthalmology

## 2018-01-18 ENCOUNTER — Ambulatory Visit (INDEPENDENT_AMBULATORY_CARE_PROVIDER_SITE_OTHER): Payer: Medicare HMO | Admitting: Ophthalmology

## 2018-01-18 DIAGNOSIS — H4423 Degenerative myopia, bilateral: Secondary | ICD-10-CM

## 2018-01-18 DIAGNOSIS — Z961 Presence of intraocular lens: Secondary | ICD-10-CM | POA: Diagnosis not present

## 2018-01-18 DIAGNOSIS — H43813 Vitreous degeneration, bilateral: Secondary | ICD-10-CM | POA: Diagnosis not present

## 2018-01-18 DIAGNOSIS — H401133 Primary open-angle glaucoma, bilateral, severe stage: Secondary | ICD-10-CM | POA: Diagnosis not present

## 2018-01-18 DIAGNOSIS — H3581 Retinal edema: Secondary | ICD-10-CM | POA: Diagnosis not present

## 2018-01-25 DIAGNOSIS — K219 Gastro-esophageal reflux disease without esophagitis: Secondary | ICD-10-CM | POA: Diagnosis not present

## 2018-01-25 DIAGNOSIS — M25552 Pain in left hip: Secondary | ICD-10-CM | POA: Diagnosis not present

## 2018-01-25 DIAGNOSIS — F419 Anxiety disorder, unspecified: Secondary | ICD-10-CM | POA: Diagnosis not present

## 2018-01-25 DIAGNOSIS — M7062 Trochanteric bursitis, left hip: Secondary | ICD-10-CM | POA: Diagnosis not present

## 2018-01-25 DIAGNOSIS — Z0000 Encounter for general adult medical examination without abnormal findings: Secondary | ICD-10-CM | POA: Diagnosis not present

## 2018-01-25 DIAGNOSIS — R42 Dizziness and giddiness: Secondary | ICD-10-CM | POA: Diagnosis not present

## 2018-01-25 DIAGNOSIS — Z6831 Body mass index (BMI) 31.0-31.9, adult: Secondary | ICD-10-CM | POA: Diagnosis not present

## 2018-01-25 DIAGNOSIS — E119 Type 2 diabetes mellitus without complications: Secondary | ICD-10-CM | POA: Diagnosis not present

## 2018-02-16 DIAGNOSIS — Z681 Body mass index (BMI) 19 or less, adult: Secondary | ICD-10-CM | POA: Diagnosis not present

## 2018-02-16 DIAGNOSIS — G894 Chronic pain syndrome: Secondary | ICD-10-CM | POA: Diagnosis not present

## 2018-02-16 DIAGNOSIS — M7062 Trochanteric bursitis, left hip: Secondary | ICD-10-CM | POA: Diagnosis not present

## 2018-03-14 DIAGNOSIS — Z6832 Body mass index (BMI) 32.0-32.9, adult: Secondary | ICD-10-CM | POA: Diagnosis not present

## 2018-03-14 DIAGNOSIS — J209 Acute bronchitis, unspecified: Secondary | ICD-10-CM | POA: Diagnosis not present

## 2018-03-14 DIAGNOSIS — J019 Acute sinusitis, unspecified: Secondary | ICD-10-CM | POA: Diagnosis not present

## 2018-04-07 DIAGNOSIS — M5416 Radiculopathy, lumbar region: Secondary | ICD-10-CM | POA: Diagnosis not present

## 2018-04-07 DIAGNOSIS — M545 Low back pain: Secondary | ICD-10-CM | POA: Diagnosis not present

## 2018-04-07 DIAGNOSIS — M25512 Pain in left shoulder: Secondary | ICD-10-CM | POA: Diagnosis not present

## 2018-04-07 DIAGNOSIS — Z79891 Long term (current) use of opiate analgesic: Secondary | ICD-10-CM | POA: Diagnosis not present

## 2018-05-19 DIAGNOSIS — H401132 Primary open-angle glaucoma, bilateral, moderate stage: Secondary | ICD-10-CM | POA: Diagnosis not present

## 2018-07-06 DIAGNOSIS — M545 Low back pain: Secondary | ICD-10-CM | POA: Diagnosis not present

## 2018-07-06 DIAGNOSIS — M5416 Radiculopathy, lumbar region: Secondary | ICD-10-CM | POA: Diagnosis not present

## 2018-07-06 DIAGNOSIS — M25512 Pain in left shoulder: Secondary | ICD-10-CM | POA: Diagnosis not present

## 2018-07-06 DIAGNOSIS — Z79891 Long term (current) use of opiate analgesic: Secondary | ICD-10-CM | POA: Diagnosis not present

## 2018-07-13 DIAGNOSIS — Z6832 Body mass index (BMI) 32.0-32.9, adult: Secondary | ICD-10-CM | POA: Diagnosis not present

## 2018-07-13 DIAGNOSIS — J0101 Acute recurrent maxillary sinusitis: Secondary | ICD-10-CM | POA: Diagnosis not present

## 2018-08-29 DIAGNOSIS — Z961 Presence of intraocular lens: Secondary | ICD-10-CM | POA: Diagnosis not present

## 2018-08-29 DIAGNOSIS — H401132 Primary open-angle glaucoma, bilateral, moderate stage: Secondary | ICD-10-CM | POA: Diagnosis not present

## 2018-08-30 NOTE — Progress Notes (Addendum)
[facility] Note  08/31/2018     CHIEF COMPLAINT Patient presents for Blurred Vision   HISTORY OF PRESENT ILLNESS: Steven Richard is a 63 y.o. male who presents to the clinic today for:   HPI    Blurred Vision    In right eye.  Vision is blurred.  Severity is moderate.  This started 6 months ago.  Occurring constantly.  It is worse throughout the day.  Context:  distance vision, mid-range vision, near vision and reading.  Associated symptoms include Negative for glare, dryness, trauma, scalp tenderness, weight loss, haloes, eye pain, pain with eye movement, jaw claudication, fatigue, double vision, redness, abnormal color vision, shoulder/hip pain, fever, headache, tearing and a need for brighter lights.  Treatments tried include eye drops.  Response to treatment was no improvement.          Comments    F/U from 6 mos ago Myopic Degen. , Patient had ov with Dr. Kittie Plater for decreased vision OD, he was advised to return to Dr. Coralyn Pear for further  retina evalation. Patient states blurred vision started 5-6 mos ago, he aslo has ocassional floaters,denies flashes and ocular pain. Pt is using Travatan, dorzolamide gtt's.       Last edited by Zenovia Jordan, LPN on 7/51/0258  5:27 AM. (History)    pt states he saw Dr. Gershon Crane on Tuesday bc his right eye vision is blurry, he states Dr. Gershon Crane told him that there is nothing wrong with his lens implants and sent him back here for further evaluation  Referring physician: Curlene Labrum, MD [place], [place] [address]  HISTORICAL INFORMATION:   Selected notes from the MEDICAL RECORD NUMBER Referred by Dr. Rutherford Guys for concern of blurred vision and possible macula changes LEE: 07.09.19 Jake Samples) [BCVA: OD: 20/40 OS: 20/30] Ocular Hx-POAG OU, Pseudophakia OU PMH-arthritis, former smoker    CURRENT MEDICATIONS: Current Outpatient Medications (Ophthalmic Drugs)  Medication Sig  . dorzolamide  (TRUSOPT) 2 % ophthalmic solution Place 1 drop into both eyes 2 (two) times daily.  . Travoprost, BAK Free, (TRAVATAN) 0.004 % SOLN ophthalmic solution Place 1 drop into both eyes at bedtime.   No current facility-administered medications for this visit.  (Ophthalmic Drugs)   Current Outpatient Medications (Other)  Medication Sig  . ALPRAZolam (XANAX) 1 MG tablet Take 1 mg by mouth daily.  Marland Kitchen aspirin EC 81 MG tablet Take 81 mg by mouth daily.  Marland Kitchen doxycycline (VIBRA-TABS) 100 MG tablet Take 1 tablet by mouth 2 (two) times daily.  . DULoxetine (CYMBALTA) 60 MG capsule Take 60 mg by mouth daily.  Marland Kitchen esomeprazole (NEXIUM) 40 MG capsule Take 40 mg by mouth daily at 12 noon.  . lubiprostone (AMITIZA) 24 MCG capsule Take 24 mcg by mouth 2 (two) times daily with a meal.  . meloxicam (MOBIC) 15 MG tablet Take 15 mg by mouth daily.  Marland Kitchen oxyCODONE-acetaminophen (PERCOCET) 10-325 MG tablet Take 1 tablet by mouth 2 (two) times daily.  Marland Kitchen PARoxetine (PAXIL) 20 MG tablet Take 1 tablet by mouth daily.  . pregabalin (LYRICA) 300 MG capsule Take 1 capsule by mouth 2 (two) times daily.   . traZODone (DESYREL) 100 MG tablet Take 1 tablet by mouth at bedtime.   No current facility-administered medications for this visit.  (Other)      REVIEW OF SYSTEMS: ROS    Positive for: Eyes   Negative for: Constitutional, Gastrointestinal, Neurological, Skin, Genitourinary, Musculoskeletal, HENT,  Endocrine, Cardiovascular, Respiratory, Psychiatric, Allergic/Imm, Heme/Lymph   Last edited by Zenovia Jordan, LPN on 2/95/1884  1:66 AM. (History)       ALLERGIES No Known Allergies  PAST MEDICAL HISTORY Past Medical History:  Diagnosis Date  . Arthritis   . Chronic back pain   . GERD (gastroesophageal reflux disease)   . Glaucoma   . History of kidney stones    history of   Past Surgical History:  Procedure Laterality Date  . CATARACT EXTRACTION W/PHACO Right 06/01/2016   Procedure: CATARACT EXTRACTION PHACO  AND INTRAOCULAR LENS PLACEMENT (IOC);  Surgeon: Rutherford Guys, MD;  Location: [facility];  Service: Ophthalmology;  Laterality: Right;  CDE:  4.34  . CATARACT EXTRACTION W/PHACO Left 06/15/2016   Procedure: CATARACT EXTRACTION PHACO AND INTRAOCULAR LENS PLACEMENT (IOC);  Surgeon: Rutherford Guys, MD;  Location: [facility];  Service: Ophthalmology;  Laterality: Left;  CDE: 4.37  . EYE SURGERY    . ORCHIECTOMY Left    ruptured testicle  . TYMPANOPLASTY Right    with pathching    FAMILY HISTORY Family History  Problem Relation Age of Onset  . Diabetes Mother   . Dementia Mother   . Diabetes Father     SOCIAL HISTORY Social History   Tobacco Use  . Smoking status: Former Smoker    Packs/day: 1.00    Years: 5.00    Pack years: 5.00    Types: Cigarettes    Last attempt to quit: 05/26/1968    Years since quitting: 50.2  . Smokeless tobacco: Never Used  Substance Use Topics  . Alcohol use: No  . Drug use: No         OPHTHALMIC EXAM:  Base Eye Exam    Visual Acuity (Snellen - Linear)      Right Left   Dist cc 20/40 20/30   Dist ph cc 20/30 20/25   Correction:  Glasses       Tonometry (Tonopen, 8:25 AM)      Right Left   Pressure 15 14       Pupils      Dark Light Shape React APD   Right 3 2 Round Brisk None   Left 3 2 Round Brisk None       Visual Fields      Left Right    Full Full       Extraocular Movement      Right Left    Full, Ortho Full, Ortho       Neuro/Psych    Oriented x3:  Yes   Mood/Affect:  Normal       Dilation    Both eyes:  1.0% Mydriacyl, 2.5% Phenylephrine @ 8:25 AM        Slit Lamp and Fundus Exam    Slit Lamp Exam      Right Left   Lids/Lashes Dermatochalasis - upper lid Dermatochalasis - upper lid   Conjunctiva/Sclera White and quiet temporal Pinguecula   Cornea Arcus, Debris in tear film, 1+ Punctate epithelial erosions, Well healed cataract wounds Arcus, Temporal Well healed cataract wounds, 1+ Punctate epithelial erosions    Anterior Chamber Deep and quiet Deep and quiet   Iris Round and dilated Round and dilated   Lens PC IOL in good position, Inferior 1+ Posterior capsular opacification PC IOL in good position   Vitreous Vitreous syneresis, Posterior vitreous detachment Vitreous syneresis, Posterior vitreous detachment       Fundus Exam      Right Left  Disc Anomalous/tilted disc, Pallor, Thin rim, especially thin inferiorly, Peripapillary atrophy Anomalous/tilted disc, +cupping, Thin rim, nasal Peripapillary atrophy   C/D Ratio 0.9 0.85   Macula Flat, Blunted foveal reflex, RPE mottling and clumping, No heme or edema flat, Blunted foveal reflex, RPE mottling and clumping, No heme or edema   Vessels Mild Vascular attenuation Mild Vascular attenuation   Periphery Attached, very blonde fundus, scattered pigmented cobblestoning inferiorly, No RT/RD Attached, very blonde fundus, inferior cobblestoning , No RT/RD          IMAGING AND PROCEDURES  Imaging and Procedures for @TODAY @  OCT, Retina - OU - Both Eyes       Right Eye Quality was good. Central Foveal Thickness: 299. Progression has been stable. Findings include normal foveal contour, no IRF, no SRF (Abnormal scleral contour and thin choroid ).   Left Eye Quality was good. Central Foveal Thickness: 294. Progression has been stable. Findings include normal foveal contour, no IRF, no SRF (Abnormal scleral contour).   Notes *Images captured and stored on drive  Diagnosis / Impression:  NFP, No IRF/SRF, abnormal scleral (myopic) contour  Clinical management:  See below  Abbreviations: NFP - Normal foveal profile. CME - cystoid macular edema. PED - pigment epithelial detachment. IRF - intraretinal fluid. SRF - subretinal fluid. EZ - ellipsoid zone. ERM - epiretinal membrane. ORA - outer retinal atrophy. ORT - outer retinal tubulation. SRHM - subretinal hyper-reflective material                  ASSESSMENT/PLAN:    ICD-10-CM   1.  Degenerative myopia of both eyes, unspecified whether complication present D17.61   2. Primary open angle glaucoma (POAG) of both eyes, severe stage H40.1133   3. Posterior vitreous detachment of both eyes H43.813   4. Retinal edema H35.81 OCT, Retina - OU - Both Eyes  5. Pseudophakia of both eyes Z96.1     1. Myopic degeneration OU-  - no significant macular changes corresponding to recent vision changes OD -- suspect symptoms related to progressive nerve damage from POAG OD -- see below - interesting BCVA on our testing improved from last visit -- 20/30 OD, and 20/25 OS today - scattered peripheral chorioretinal changes OU - no retinal tears or detachments - F/U prn  2. 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 Gershon Crane  3. PVD / vitreous syneresis OU  Discussed findings and prognosis  No RT or RD on 360 peripheral exam  Reviewed s/s of RT/RD  Strict return precautions for any such RT/RD signs/symptoms  4. No retinal edema on exam or OCT  5. Pseudophakia OU  - s/p CE/IOL OU  - beautiful surgeries, doing well  - monitor   Ophthalmic Meds Ordered this visit:  No orders of the defined types were placed in this encounter.      Return if symptoms worsen or fail to improve.  There are no Patient Instructions on file for this visit.   Explained the diagnoses, plan, and follow up with the patient and they expressed understanding.  Patient expressed understanding of the importance  of proper follow up care.   This document serves as a record of services personally  performed by Gardiner Sleeper, MD, PhD. It was created on their behalf by Ernest Mallick, OA, an ophthalmic assistant. The creation of this record is the provider's dictation and/or activities during the visit.    Electronically signed by: Ernest Mallick, OA  02.26.2020 9:14 AM    Gardiner Sleeper, M.D., Ph.D. Diseases & Surgery of the Retina and Vitreous [facility]  I have reviewed the above documentation for accuracy and completeness, and I agree with the above. Gardiner Sleeper, M.D., Ph.D. 08/31/18 9:14 AM    Abbreviations: M myopia (nearsighted); A astigmatism; H hyperopia (farsighted); P presbyopia; Mrx spectacle prescription;  CTL contact lenses; OD right eye; OS left eye; OU both eyes  XT exotropia; ET esotropia; PEK punctate epithelial keratitis; PEE punctate epithelial erosions; DES dry eye syndrome; MGD meibomian gland dysfunction; ATs artificial tears; PFAT's preservative free artificial tears; [place] nuclear sclerotic cataract; PSC posterior subcapsular cataract; ERM epi-retinal membrane; PVD posterior vitreous detachment; RD retinal detachment; DM diabetes mellitus; DR diabetic retinopathy; NPDR non-proliferative diabetic retinopathy; PDR proliferative diabetic retinopathy; CSME clinically significant macular edema; DME diabetic macular edema; dbh dot blot hemorrhages; CWS cotton wool spot; POAG primary open angle glaucoma; C/D cup-to-disc ratio; HVF humphrey visual field; GVF goldmann visual field; OCT optical coherence tomography; IOP intraocular pressure; BRVO Branch retinal vein occlusion; CRVO central retinal vein occlusion; CRAO central retinal artery occlusion; BRAO branch retinal artery occlusion; RT retinal tear; SB scleral buckle; PPV pars plana vitrectomy; VH Vitreous hemorrhage; PRP panretinal laser photocoagulation; IVK intravitreal kenalog; VMT vitreomacular traction; MH Macular hole;  NVD neovascularization of the disc; NVE neovascularization elsewhere; AREDS  age related eye disease study; ARMD age related macular degeneration; POAG primary open angle glaucoma; EBMD epithelial/anterior basement membrane dystrophy; ACIOL anterior chamber intraocular lens; IOL intraocular lens; PCIOL posterior chamber intraocular lens; Phaco/IOL phacoemulsification with intraocular lens placement; [place] photorefractive keratectomy; LASIK laser assisted in situ keratomileusis; HTN hypertension; DM diabetes mellitus; COPD chronic obstructive pulmonary disease

## 2018-08-31 ENCOUNTER — Encounter (INDEPENDENT_AMBULATORY_CARE_PROVIDER_SITE_OTHER): Payer: Self-pay | Admitting: Ophthalmology

## 2018-08-31 ENCOUNTER — Ambulatory Visit (INDEPENDENT_AMBULATORY_CARE_PROVIDER_SITE_OTHER): Payer: Medicare HMO | Admitting: Ophthalmology

## 2018-08-31 DIAGNOSIS — Z961 Presence of intraocular lens: Secondary | ICD-10-CM | POA: Diagnosis not present

## 2018-08-31 DIAGNOSIS — H43813 Vitreous degeneration, bilateral: Secondary | ICD-10-CM | POA: Diagnosis not present

## 2018-08-31 DIAGNOSIS — H3581 Retinal edema: Secondary | ICD-10-CM

## 2018-08-31 DIAGNOSIS — H401133 Primary open-angle glaucoma, bilateral, severe stage: Secondary | ICD-10-CM

## 2018-08-31 DIAGNOSIS — H4423 Degenerative myopia, bilateral: Secondary | ICD-10-CM

## 2018-09-28 DIAGNOSIS — M545 Low back pain: Secondary | ICD-10-CM | POA: Diagnosis not present

## 2018-09-28 DIAGNOSIS — M25512 Pain in left shoulder: Secondary | ICD-10-CM | POA: Diagnosis not present

## 2018-09-28 DIAGNOSIS — Z79891 Long term (current) use of opiate analgesic: Secondary | ICD-10-CM | POA: Diagnosis not present

## 2018-09-28 DIAGNOSIS — M25552 Pain in left hip: Secondary | ICD-10-CM | POA: Diagnosis not present

## 2018-09-28 DIAGNOSIS — M5416 Radiculopathy, lumbar region: Secondary | ICD-10-CM | POA: Diagnosis not present

## 2018-12-12 DIAGNOSIS — M79675 Pain in left toe(s): Secondary | ICD-10-CM | POA: Diagnosis not present

## 2018-12-12 DIAGNOSIS — L03032 Cellulitis of left toe: Secondary | ICD-10-CM | POA: Diagnosis not present

## 2018-12-20 DIAGNOSIS — G894 Chronic pain syndrome: Secondary | ICD-10-CM | POA: Diagnosis not present

## 2018-12-20 DIAGNOSIS — Z6832 Body mass index (BMI) 32.0-32.9, adult: Secondary | ICD-10-CM | POA: Diagnosis not present

## 2018-12-20 DIAGNOSIS — E119 Type 2 diabetes mellitus without complications: Secondary | ICD-10-CM | POA: Diagnosis not present

## 2018-12-20 DIAGNOSIS — F411 Generalized anxiety disorder: Secondary | ICD-10-CM | POA: Diagnosis not present

## 2018-12-21 DIAGNOSIS — M5416 Radiculopathy, lumbar region: Secondary | ICD-10-CM | POA: Diagnosis not present

## 2018-12-21 DIAGNOSIS — M25552 Pain in left hip: Secondary | ICD-10-CM | POA: Diagnosis not present

## 2018-12-21 DIAGNOSIS — M545 Low back pain: Secondary | ICD-10-CM | POA: Diagnosis not present

## 2018-12-21 DIAGNOSIS — M25512 Pain in left shoulder: Secondary | ICD-10-CM | POA: Diagnosis not present

## 2018-12-26 DIAGNOSIS — M79676 Pain in unspecified toe(s): Secondary | ICD-10-CM | POA: Diagnosis not present

## 2018-12-26 DIAGNOSIS — L03031 Cellulitis of right toe: Secondary | ICD-10-CM | POA: Diagnosis not present

## 2019-01-02 DIAGNOSIS — H401132 Primary open-angle glaucoma, bilateral, moderate stage: Secondary | ICD-10-CM | POA: Diagnosis not present

## 2019-01-02 DIAGNOSIS — H524 Presbyopia: Secondary | ICD-10-CM | POA: Diagnosis not present

## 2019-01-02 DIAGNOSIS — H52203 Unspecified astigmatism, bilateral: Secondary | ICD-10-CM | POA: Diagnosis not present

## 2019-01-02 DIAGNOSIS — Z961 Presence of intraocular lens: Secondary | ICD-10-CM | POA: Diagnosis not present

## 2019-01-18 NOTE — Progress Notes (Addendum)
[facility] Note  01/19/2019     CHIEF COMPLAINT Patient presents for Retina Follow Up   HISTORY OF PRESENT ILLNESS: Steven Richard is a 63 y.o. male who presents to the clinic today for:  HPI    Retina Follow Up    Patient presents with  Other.  In both eyes.  Severity is moderate.  Duration of 5 months.  Since onset it is gradually worsening.  I, the attending physician,  performed the HPI with the patient and updated documentation appropriately.          Comments    5 month retina follow up. Patient states vision in the right eye is gradually getting worse last 5 months. Patient states he doesn't think his glasses are strong enough.       Last edited by Bernarda Caffey, MD on 01/19/2019  9:46 AM. (History)    Patient states vision getting worse OD. Saw Dr. Gershon Crane last week  Referring physician: Rutherford Guys, [place],  [place] [id number]  HISTORICAL INFORMATION:   Selected notes from the MEDICAL RECORD NUMBER Referred by Dr. Rutherford Guys for concern of blurred vision and possible macula changes LEE: 07.09.19 Jake Samples) [BCVA: OD: 20/40 OS: 20/30] Ocular Hx-POAG OU, Pseudophakia OU PMH-arthritis, former smoker    CURRENT MEDICATIONS: Current Outpatient Medications (Ophthalmic Drugs)  Medication Sig  . dorzolamide (TRUSOPT) 2 % ophthalmic solution Place 1 drop into both eyes 2 (two) times daily.  . Travoprost, BAK Free, (TRAVATAN) 0.004 % SOLN ophthalmic solution Place 1 drop into both eyes at bedtime.   No current facility-administered medications for this visit.  (Ophthalmic Drugs)   Current Outpatient Medications (Other)  Medication Sig  . ALPRAZolam (XANAX) 1 MG tablet Take 1 mg by mouth daily.  Marland Kitchen aspirin EC 81 MG tablet Take 81 mg by mouth daily.  Marland Kitchen doxycycline (VIBRA-TABS) 100 MG tablet Take 1 tablet by mouth 2 (two) times daily.  . DULoxetine (CYMBALTA) 60 MG capsule Take 60 mg by mouth daily.  Marland Kitchen esomeprazole  (NEXIUM) 40 MG capsule Take 40 mg by mouth daily at 12 noon.  . lubiprostone (AMITIZA) 24 MCG capsule Take 24 mcg by mouth 2 (two) times daily with a meal.  . meloxicam (MOBIC) 15 MG tablet Take 15 mg by mouth daily.  Marland Kitchen oxyCODONE-acetaminophen (PERCOCET) 10-325 MG tablet Take 1 tablet by mouth 2 (two) times daily.  Marland Kitchen PARoxetine (PAXIL) 20 MG tablet Take 1 tablet by mouth daily.  . pregabalin (LYRICA) 300 MG capsule Take 1 capsule by mouth 2 (two) times daily.   . traZODone (DESYREL) 100 MG tablet Take 1 tablet by mouth at bedtime.   No current facility-administered medications for this visit.  (Other)      REVIEW OF SYSTEMS: ROS    Positive for: Eyes   Negative for: Constitutional, Gastrointestinal, Neurological, Skin, Genitourinary, Musculoskeletal, HENT, Endocrine, Cardiovascular, Respiratory, Psychiatric, Allergic/Imm, Heme/Lymph   Last edited by Elmore Guise on 01/19/2019  8:39 AM. (History)       ALLERGIES No Known Allergies  PAST MEDICAL HISTORY Past Medical History:  Diagnosis Date  . Arthritis   . Chronic back pain   . GERD (gastroesophageal reflux disease)   . Glaucoma   . History of kidney stones    history of   Past Surgical History:  Procedure Laterality Date  . CATARACT EXTRACTION W/PHACO Right 06/01/2016   Procedure: CATARACT EXTRACTION PHACO AND INTRAOCULAR LENS PLACEMENT (IOC);  Surgeon:  Rutherford Guys, MD;  Location: [facility];  Service: Ophthalmology;  Laterality: Right;  CDE:  4.34  . CATARACT EXTRACTION W/PHACO Left 06/15/2016   Procedure: CATARACT EXTRACTION PHACO AND INTRAOCULAR LENS PLACEMENT (IOC);  Surgeon: Rutherford Guys, MD;  Location: [facility];  Service: Ophthalmology;  Laterality: Left;  CDE: 4.37  . EYE SURGERY    . ORCHIECTOMY Left    ruptured testicle  . TYMPANOPLASTY Right    with pathching    FAMILY HISTORY Family History  Problem Relation Age of Onset  . Diabetes Mother   . Dementia Mother   . Diabetes Father     SOCIAL  HISTORY Social History   Tobacco Use  . Smoking status: Former Smoker    Packs/day: 1.00    Years: 5.00    Pack years: 5.00    Types: Cigarettes    Quit date: 05/26/1968    Years since quitting: 50.6  . Smokeless tobacco: Never Used  Substance Use Topics  . Alcohol use: No  . Drug use: No         OPHTHALMIC EXAM:  Base Eye Exam    Visual Acuity (Snellen - Linear)      Right Left   Dist cc 20/40 20/20-1   Dist ph cc 20/30-1    Correction: Glasses       Tonometry (Tonopen, 8:40 AM)      Right Left   Pressure 18 15       Pupils      Dark Light Shape React APD   Right 3 2 Round Brisk None   Left 3 2 Round Brisk None       Visual Fields (Counting fingers)      Left Right    Full Full       Extraocular Movement      Right Left    Full, Ortho Full, Ortho       Neuro/Psych    Oriented x3: Yes   Mood/Affect: Normal       Dilation    Both eyes: 1.0% Mydriacyl, 2.5% Phenylephrine @ 8:40 AM        Slit Lamp and Fundus Exam    Slit Lamp Exam      Right Left   Lids/Lashes Dermatochalasis - upper lid, mild ptosis Dermatochalasis - upper lid   Conjunctiva/Sclera White and quiet temporal Pinguecula   Cornea Arcus, Debris in tear film, trace Punctate epithelial erosions, Well healed cataract wounds Mild Arcus, Temporal Well healed cataract wounds, trace Punctate epithelial erosions   Anterior Chamber Deep and quiet Deep and quiet   Iris Round and dilated Round and dilated   Lens PC IOL in good position, Inferior 1+ Posterior capsular opacification PC IOL in good position   Vitreous Vitreous syneresis, Posterior vitreous detachment Vitreous syneresis, Posterior vitreous detachment       Fundus Exam      Right Left   Disc Anomalous/tilted disc, Pallor, Thin rim, especially thin inferiorly, Peripapillary atrophy Anomalous/tilted disc, +cupping, Thin rim, nasal Peripapillary atrophy   C/D Ratio 0.9 0.85   Macula Flat, Blunted foveal reflex, RPE mottling and  clumping, No heme or edema flat, Blunted foveal reflex, RPE mottling and clumping, No heme or edema   Vessels Mild Vascular attenuation, mild tortuosity Mild Vascular attenuation   Periphery Attached, very blonde fundus, scattered pigmented cobblestoning inferiorly, No RT/RD Attached, very blonde fundus, inferior cobblestoning , No RT/RD        Refraction    Wearing Rx  Sphere Cylinder Axis   Right +0.25 +1.50 100   Left +0.25 +1.75 100       Manifest Refraction (Auto)      Sphere Cylinder Axis Dist VA   Right -0.50 +1.50 092 20/30   Left +0.25 +0.50 114 20/20          IMAGING AND PROCEDURES  Imaging and Procedures for @TODAY @  OCT, Retina - OU - Both Eyes       Right Eye Quality was good. Central Foveal Thickness: 296. Progression has been stable. Findings include normal foveal contour, no IRF, no SRF, myopic contour (Abnormal scleral contour and thin choroid ).   Left Eye Quality was good. Central Foveal Thickness: 299. Progression has been stable. Findings include normal foveal contour, no IRF, no SRF, myopic contour (Abnormal scleral contour).   Notes *Images captured and stored on drive  Diagnosis / Impression:  OU: NFP, No IRF/SRF, abnormal scleral (myopic) contour  Clinical management:  See below  Abbreviations: NFP - Normal foveal profile. CME - cystoid macular edema. PED - pigment epithelial detachment. IRF - intraretinal fluid. SRF - subretinal fluid. EZ - ellipsoid zone. ERM - epiretinal membrane. ORA - outer retinal atrophy. ORT - outer retinal tubulation. SRHM - subretinal hyper-reflective material                  ASSESSMENT/PLAN:    ICD-10-CM   1. Degenerative myopia of both eyes, unspecified whether complication present  H20.94   2. Primary open angle glaucoma (POAG) of both eyes, severe stage  H40.1133   3. Posterior vitreous detachment of both eyes  H43.813   4. Retinal edema  H35.81 OCT, Retina - OU - Both Eyes  5. Pseudophakia of  both eyes  Z96.1     1. Myopic degeneration OU-   - no significant macular changes corresponding to subjective vision changes OD -- suspect symptoms related to progressive nerve damage from POAG OD -- see below  - BCVA stable from last visit -- 20/30 OD, and 20/20 OS today  - scattered peripheral chorioretinal changes OU  - no retinal tears or detachments  - F/U prn- clear from a retina standpoint for return to Dr. Gershon Crane for primary eye care  2. 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 Gershon Crane  3. PVD / vitreous syneresis OU  - Discussed findings and prognosis  - No RT or RD on 360 peripheral exam  - Reviewed s/s of RT/RD  - Strict return precautions for any such RT/RD signs/symptoms  4. No retinal edema on exam or OCT  5. Pseudophakia OU  - s/p CE/IOL OU  - beautiful surgeries, doing well  - monitor   Ophthalmic Meds Ordered this visit:  No orders of the defined types were placed in this encounter.      Return if symptoms worsen or fail to improve.  There are no Patient Instructions on file for this visit.   Explained the diagnoses, plan, and follow up with the patient and they expressed understanding.  Patient expressed understanding of the importance of  proper follow up care.   This document serves as a record of services personally performed by Gardiner Sleeper, MD, PhD. It was created on their behalf by Ernest Mallick, OA, an ophthalmic assistant. The creation of this record is the provider's dictation and/or activities during the visit.     Electronically signed by: Ernest Mallick, OA  07.16.2020 2:22 PM     Gardiner Sleeper, M.D., Ph.D. Diseases & Surgery of the Retina and Vitreous [facility]  I have reviewed the above documentation for accuracy and completeness, and I agree with the above. Gardiner Sleeper, M.D., Ph.D. 01/19/19 2:22 PM    Abbreviations: M myopia (nearsighted); A astigmatism; H hyperopia (farsighted); P presbyopia; Mrx spectacle prescription;  CTL contact lenses; OD right eye; OS left eye; OU both eyes  XT exotropia; ET esotropia; PEK punctate epithelial keratitis; PEE punctate epithelial erosions; DES dry eye syndrome; MGD meibomian gland dysfunction; ATs artificial tears; PFAT's preservative free artificial tears; [place] nuclear sclerotic cataract; PSC posterior subcapsular cataract; ERM epi-retinal membrane; PVD posterior vitreous detachment; RD retinal detachment; DM diabetes mellitus; DR diabetic retinopathy; NPDR non-proliferative diabetic retinopathy; PDR proliferative diabetic retinopathy; CSME clinically significant macular edema; DME diabetic macular edema; dbh dot blot hemorrhages; CWS cotton wool spot; POAG primary open angle glaucoma; C/D cup-to-disc ratio; HVF humphrey visual field; GVF goldmann visual field; OCT optical coherence tomography; IOP intraocular pressure; BRVO Branch retinal vein occlusion; CRVO central retinal vein occlusion; CRAO central retinal artery occlusion; BRAO branch retinal artery occlusion; RT retinal tear; SB scleral buckle; PPV pars plana vitrectomy; VH Vitreous hemorrhage; PRP panretinal laser photocoagulation; IVK intravitreal kenalog; VMT vitreomacular traction; MH Macular hole;  NVD neovascularization of the disc; NVE neovascularization elsewhere; AREDS age related eye disease study; ARMD age related macular degeneration; POAG primary open angle glaucoma; EBMD epithelial/anterior basement membrane dystrophy; ACIOL anterior chamber intraocular lens; IOL  intraocular lens; PCIOL posterior chamber intraocular lens; Phaco/IOL phacoemulsification with intraocular lens placement; [place] photorefractive keratectomy; LASIK laser assisted in situ keratomileusis; HTN hypertension; DM diabetes mellitus; COPD chronic obstructive pulmonary disease

## 2019-01-19 ENCOUNTER — Ambulatory Visit (INDEPENDENT_AMBULATORY_CARE_PROVIDER_SITE_OTHER): Payer: Medicare HMO | Admitting: Ophthalmology

## 2019-01-19 ENCOUNTER — Other Ambulatory Visit: Payer: Self-pay

## 2019-01-19 ENCOUNTER — Encounter (INDEPENDENT_AMBULATORY_CARE_PROVIDER_SITE_OTHER): Payer: Self-pay | Admitting: Ophthalmology

## 2019-01-19 DIAGNOSIS — H43813 Vitreous degeneration, bilateral: Secondary | ICD-10-CM

## 2019-01-19 DIAGNOSIS — H4423 Degenerative myopia, bilateral: Secondary | ICD-10-CM | POA: Diagnosis not present

## 2019-01-19 DIAGNOSIS — H401133 Primary open-angle glaucoma, bilateral, severe stage: Secondary | ICD-10-CM

## 2019-01-19 DIAGNOSIS — Z961 Presence of intraocular lens: Secondary | ICD-10-CM

## 2019-01-19 DIAGNOSIS — H3581 Retinal edema: Secondary | ICD-10-CM | POA: Diagnosis not present

## 2019-02-27 DIAGNOSIS — M545 Low back pain: Secondary | ICD-10-CM | POA: Diagnosis not present

## 2019-02-27 DIAGNOSIS — M25512 Pain in left shoulder: Secondary | ICD-10-CM | POA: Diagnosis not present

## 2019-02-27 DIAGNOSIS — M25552 Pain in left hip: Secondary | ICD-10-CM | POA: Diagnosis not present

## 2019-02-27 DIAGNOSIS — Z79891 Long term (current) use of opiate analgesic: Secondary | ICD-10-CM | POA: Diagnosis not present

## 2019-02-27 DIAGNOSIS — M5416 Radiculopathy, lumbar region: Secondary | ICD-10-CM | POA: Diagnosis not present

## 2019-05-04 DIAGNOSIS — I10 Essential (primary) hypertension: Secondary | ICD-10-CM | POA: Diagnosis not present

## 2019-05-04 DIAGNOSIS — E782 Mixed hyperlipidemia: Secondary | ICD-10-CM | POA: Diagnosis not present

## 2019-05-10 DIAGNOSIS — H04123 Dry eye syndrome of bilateral lacrimal glands: Secondary | ICD-10-CM | POA: Diagnosis not present

## 2019-05-10 DIAGNOSIS — H401132 Primary open-angle glaucoma, bilateral, moderate stage: Secondary | ICD-10-CM | POA: Diagnosis not present

## 2019-05-22 DIAGNOSIS — M25552 Pain in left hip: Secondary | ICD-10-CM | POA: Diagnosis not present

## 2019-05-22 DIAGNOSIS — M25512 Pain in left shoulder: Secondary | ICD-10-CM | POA: Diagnosis not present

## 2019-05-22 DIAGNOSIS — M545 Low back pain: Secondary | ICD-10-CM | POA: Diagnosis not present

## 2019-05-22 DIAGNOSIS — M5416 Radiculopathy, lumbar region: Secondary | ICD-10-CM | POA: Diagnosis not present

## 2019-06-04 DIAGNOSIS — F411 Generalized anxiety disorder: Secondary | ICD-10-CM | POA: Diagnosis not present

## 2019-06-04 DIAGNOSIS — E119 Type 2 diabetes mellitus without complications: Secondary | ICD-10-CM | POA: Diagnosis not present

## 2019-07-04 DIAGNOSIS — R001 Bradycardia, unspecified: Secondary | ICD-10-CM | POA: Diagnosis not present

## 2019-07-04 DIAGNOSIS — R42 Dizziness and giddiness: Secondary | ICD-10-CM | POA: Diagnosis not present

## 2019-07-04 DIAGNOSIS — F419 Anxiety disorder, unspecified: Secondary | ICD-10-CM | POA: Diagnosis not present

## 2019-07-04 DIAGNOSIS — Z0000 Encounter for general adult medical examination without abnormal findings: Secondary | ICD-10-CM | POA: Diagnosis not present

## 2019-07-04 DIAGNOSIS — Z6831 Body mass index (BMI) 31.0-31.9, adult: Secondary | ICD-10-CM | POA: Diagnosis not present

## 2019-07-05 DIAGNOSIS — E782 Mixed hyperlipidemia: Secondary | ICD-10-CM | POA: Diagnosis not present

## 2019-07-05 DIAGNOSIS — I10 Essential (primary) hypertension: Secondary | ICD-10-CM | POA: Diagnosis not present

## 2019-07-11 ENCOUNTER — Encounter: Payer: Self-pay | Admitting: Cardiology

## 2019-07-11 ENCOUNTER — Ambulatory Visit (INDEPENDENT_AMBULATORY_CARE_PROVIDER_SITE_OTHER): Payer: Medicare HMO | Admitting: Cardiology

## 2019-07-11 ENCOUNTER — Encounter: Payer: Self-pay | Admitting: *Deleted

## 2019-07-11 ENCOUNTER — Telehealth: Payer: Self-pay | Admitting: Cardiology

## 2019-07-11 ENCOUNTER — Other Ambulatory Visit: Payer: Self-pay

## 2019-07-11 VITALS — BP 126/73 | HR 52 | Ht 70.0 in | Wt 225.6 lb

## 2019-07-11 DIAGNOSIS — R001 Bradycardia, unspecified: Secondary | ICD-10-CM

## 2019-07-11 NOTE — Telephone Encounter (Signed)
Pre-cert Verification for the following procedure    24 hr holter - bradycardia

## 2019-07-11 NOTE — Patient Instructions (Addendum)
Medication Instructions:  Continue all current medications.  Labwork: none  Testing/Procedures:  Your physician has recommended that you wear a 24 hour holter monitor. Holter monitors are medical devices that record the heart's electrical activity. Doctors most often use these monitors to diagnose arrhythmias. Arrhythmias are problems with the speed or rhythm of the heartbeat. The monitor is a small, portable device. You can wear one while you do your normal daily activities. This is usually used to diagnose what is causing palpitations/syncope (passing out).  Office will contact with results via phone or letter.    Follow-Up: Pending test results   Any Other Special Instructions Will Be Listed Below (If Applicable).  If you need a refill on your cardiac medications before your next appointment, please call your pharmacy.

## 2019-07-11 NOTE — Progress Notes (Signed)
Clinical Summary Steven Richard is a 64 y.o.male seen as new consult, referred by Dr Kelby Aline for the following medical problems.   1. Bradycardia - can have some dizziness at times. Mainly with lifting head upwards. Sometimes when he first getting out of bed.  - no t presyncope or syncope - nonspecific generalized fatigue    Past Medical History:  Diagnosis Date  . Arthritis   . Chronic back pain   . GERD (gastroesophageal reflux disease)   . Glaucoma   . History of kidney stones    history of     No Known Allergies   Current Outpatient Medications  Medication Sig Dispense Refill  . ALPRAZolam (XANAX) 1 MG tablet Take 1 mg by mouth daily.    Marland Kitchen aspirin EC 81 MG tablet Take 81 mg by mouth daily.    . dorzolamide (TRUSOPT) 2 % ophthalmic solution Place 1 drop into both eyes 2 (two) times daily.    Marland Kitchen doxycycline (VIBRA-TABS) 100 MG tablet Take 1 tablet by mouth 2 (two) times daily.    . DULoxetine (CYMBALTA) 60 MG capsule Take 60 mg by mouth daily.    Marland Kitchen esomeprazole (NEXIUM) 40 MG capsule Take 40 mg by mouth daily at 12 noon.    . lubiprostone (AMITIZA) 24 MCG capsule Take 24 mcg by mouth 2 (two) times daily with a meal.    . meloxicam (MOBIC) 15 MG tablet Take 15 mg by mouth daily.    Marland Kitchen oxyCODONE-acetaminophen (PERCOCET) 10-325 MG tablet Take 1 tablet by mouth 2 (two) times daily.    Marland Kitchen PARoxetine (PAXIL) 20 MG tablet Take 1 tablet by mouth daily.    . pregabalin (LYRICA) 300 MG capsule Take 1 capsule by mouth 2 (two) times daily.     . Travoprost, BAK Free, (TRAVATAN) 0.004 % SOLN ophthalmic solution Place 1 drop into both eyes at bedtime.    . traZODone (DESYREL) 100 MG tablet Take 1 tablet by mouth at bedtime.     No current facility-administered medications for this visit.     Past Surgical History:  Procedure Laterality Date  . CATARACT EXTRACTION W/PHACO Right 06/01/2016   Procedure: CATARACT EXTRACTION PHACO AND INTRAOCULAR LENS PLACEMENT (IOC);  Surgeon: Rutherford Guys, MD;  Location: [facility];  Service: Ophthalmology;  Laterality: Right;  CDE:  4.34  . CATARACT EXTRACTION W/PHACO Left 06/15/2016   Procedure: CATARACT EXTRACTION PHACO AND INTRAOCULAR LENS PLACEMENT (IOC);  Surgeon: Rutherford Guys, MD;  Location: [facility];  Service: Ophthalmology;  Laterality: Left;  CDE: 4.37  . EYE SURGERY    . ORCHIECTOMY Left    ruptured testicle  . TYMPANOPLASTY Right    with pathching     No Known Allergies    Family History  Problem Relation Age of Onset  . Diabetes Mother   . Dementia Mother   . Diabetes Father      Social History Steven Richard reports that he quit smoking about 51 years ago. His smoking use included cigarettes. He has a 5.00 pack-year smoking history. He has never used smokeless tobacco. Steven Richard reports no history of alcohol use.   Review of Systems CONSTITUTIONAL: No weight loss, fever, chills, weakness or fatigue.  HEENT: Eyes: No visual loss, blurred vision, double vision or yellow sclerae.No hearing loss, sneezing, congestion, runny nose or sore throat.  SKIN: No rash or itching.  CARDIOVASCULAR: no chest pain, no palpitations.  RESPIRATORY: No shortness of breath, cough or sputum.  GASTROINTESTINAL: No anorexia, nausea,  vomiting or diarrhea. No abdominal pain or blood.  GENITOURINARY: No burning on urination, no polyuria NEUROLOGICAL: dizziness at times MUSCULOSKELETAL: No muscle, back pain, joint pain or stiffness.  LYMPHATICS: No enlarged nodes. No history of splenectomy.  PSYCHIATRIC: No history of depression or anxiety.  ENDOCRINOLOGIC: No reports of sweating, cold or heat intolerance. No polyuria or polydipsia.  Marland Kitchen   Physical Examination Vitals:   07/11/19 1324  BP: 126/73  Pulse: (!) 52  SpO2: 97%   Gen: resting comfortably, no acute distress HEENT: no scleral icterus, pupils equal round and reactive, no palptable cervical adenopathy,  CV: Regular, rate 55, no m/r/g, no jvd Resp: Clear to auscultation  bilaterally GI: abdomen is soft, non-tender, non-distended, normal bowel sounds, no hepatosplenomegaly MSK: extremities are warm, no edema.  Skin: warm, no rash Neuro:  no focal deficits Psych: appropriate affect     Assessment and Plan   1. Bradycardia - EKG from pcp sinus brady 42, EKG today SR 54 - symptoms of dizziness are not consistent with bradycardia as the etiology. Mainly occur with tilting head back, or when first getting up in from bed - obtain 24 hour holter to better define degree of bradycardia and look for any intermittent block -request pcp labs f/u on TSH. He is not on any meds that would slow his rates down  F/u pending 24 holter, likely 1 year f/u   Arnoldo Lenis, M.D.,

## 2019-08-07 DIAGNOSIS — R001 Bradycardia, unspecified: Secondary | ICD-10-CM | POA: Diagnosis not present

## 2019-08-10 ENCOUNTER — Encounter (INDEPENDENT_AMBULATORY_CARE_PROVIDER_SITE_OTHER): Payer: Medicare HMO

## 2019-08-10 ENCOUNTER — Other Ambulatory Visit: Payer: Self-pay | Admitting: *Deleted

## 2019-08-10 DIAGNOSIS — R001 Bradycardia, unspecified: Secondary | ICD-10-CM | POA: Diagnosis not present

## 2019-08-14 DIAGNOSIS — M545 Low back pain: Secondary | ICD-10-CM | POA: Diagnosis not present

## 2019-08-14 DIAGNOSIS — M25552 Pain in left hip: Secondary | ICD-10-CM | POA: Diagnosis not present

## 2019-08-14 DIAGNOSIS — Z79891 Long term (current) use of opiate analgesic: Secondary | ICD-10-CM | POA: Diagnosis not present

## 2019-08-14 DIAGNOSIS — M25512 Pain in left shoulder: Secondary | ICD-10-CM | POA: Diagnosis not present

## 2019-08-14 DIAGNOSIS — M5416 Radiculopathy, lumbar region: Secondary | ICD-10-CM | POA: Diagnosis not present

## 2019-08-27 ENCOUNTER — Telehealth: Payer: Self-pay | Admitting: Cardiology

## 2019-08-27 NOTE — Telephone Encounter (Signed)
Just sent to your inbox  Zandra Abts MD

## 2019-08-27 NOTE — Telephone Encounter (Signed)
Patient called requesting results of recent heart monitor. 

## 2019-08-28 ENCOUNTER — Telehealth: Payer: Self-pay | Admitting: *Deleted

## 2019-08-28 NOTE — Telephone Encounter (Signed)
-----   Message from Arnoldo Lenis, MD sent at 08/27/2019  4:43 PM EST ----- Heart monitor overall looks good. No significant abnormal heart rhythms, just some occasoinal extra heart beats which are common and benign. Occasional mildly slow heart rates but infrequent and his average heart rate is 63 which his normal. 1 year f/u is fine   Zandra Abts MD

## 2019-08-29 NOTE — Telephone Encounter (Signed)
Patient informed. Copy sent to PCP °

## 2019-08-29 NOTE — Telephone Encounter (Signed)
Patient calling to check on results

## 2019-09-10 DIAGNOSIS — H04123 Dry eye syndrome of bilateral lacrimal glands: Secondary | ICD-10-CM | POA: Diagnosis not present

## 2019-09-10 DIAGNOSIS — H401132 Primary open-angle glaucoma, bilateral, moderate stage: Secondary | ICD-10-CM | POA: Diagnosis not present

## 2019-10-12 DIAGNOSIS — Z6831 Body mass index (BMI) 31.0-31.9, adult: Secondary | ICD-10-CM | POA: Diagnosis not present

## 2019-10-12 DIAGNOSIS — M25511 Pain in right shoulder: Secondary | ICD-10-CM | POA: Diagnosis not present

## 2019-10-12 DIAGNOSIS — M7501 Adhesive capsulitis of right shoulder: Secondary | ICD-10-CM | POA: Diagnosis not present

## 2019-11-06 DIAGNOSIS — M25519 Pain in unspecified shoulder: Secondary | ICD-10-CM | POA: Diagnosis not present

## 2019-11-06 DIAGNOSIS — Z79891 Long term (current) use of opiate analgesic: Secondary | ICD-10-CM | POA: Diagnosis not present

## 2019-11-06 DIAGNOSIS — M545 Low back pain: Secondary | ICD-10-CM | POA: Diagnosis not present

## 2019-11-06 DIAGNOSIS — K5900 Constipation, unspecified: Secondary | ICD-10-CM | POA: Diagnosis not present

## 2019-11-06 DIAGNOSIS — G4700 Insomnia, unspecified: Secondary | ICD-10-CM | POA: Diagnosis not present

## 2019-11-06 DIAGNOSIS — F419 Anxiety disorder, unspecified: Secondary | ICD-10-CM | POA: Diagnosis not present

## 2019-11-06 DIAGNOSIS — M5416 Radiculopathy, lumbar region: Secondary | ICD-10-CM | POA: Diagnosis not present

## 2019-11-06 DIAGNOSIS — K219 Gastro-esophageal reflux disease without esophagitis: Secondary | ICD-10-CM | POA: Diagnosis not present

## 2019-11-06 DIAGNOSIS — M25559 Pain in unspecified hip: Secondary | ICD-10-CM | POA: Diagnosis not present

## 2019-11-28 DIAGNOSIS — M25511 Pain in right shoulder: Secondary | ICD-10-CM | POA: Diagnosis not present

## 2019-12-03 DIAGNOSIS — M9979 Connective tissue and disc stenosis of intervertebral foramina of abdomen and other regions: Secondary | ICD-10-CM | POA: Diagnosis not present

## 2019-12-03 DIAGNOSIS — E1165 Type 2 diabetes mellitus with hyperglycemia: Secondary | ICD-10-CM | POA: Diagnosis not present

## 2019-12-03 DIAGNOSIS — K219 Gastro-esophageal reflux disease without esophagitis: Secondary | ICD-10-CM | POA: Diagnosis not present

## 2019-12-04 DIAGNOSIS — S61217A Laceration without foreign body of left little finger without damage to nail, initial encounter: Secondary | ICD-10-CM | POA: Diagnosis not present

## 2019-12-04 DIAGNOSIS — S62637A Displaced fracture of distal phalanx of left little finger, initial encounter for closed fracture: Secondary | ICD-10-CM | POA: Diagnosis not present

## 2019-12-04 DIAGNOSIS — X58XXXA Exposure to other specified factors, initial encounter: Secondary | ICD-10-CM | POA: Diagnosis not present

## 2019-12-04 DIAGNOSIS — S61317A Laceration without foreign body of left little finger with damage to nail, initial encounter: Secondary | ICD-10-CM | POA: Diagnosis not present

## 2019-12-04 DIAGNOSIS — Z23 Encounter for immunization: Secondary | ICD-10-CM | POA: Diagnosis not present

## 2019-12-04 DIAGNOSIS — S62657A Nondisplaced fracture of medial phalanx of left little finger, initial encounter for closed fracture: Secondary | ICD-10-CM | POA: Diagnosis not present

## 2019-12-05 DIAGNOSIS — S61217D Laceration without foreign body of left little finger without damage to nail, subsequent encounter: Secondary | ICD-10-CM | POA: Diagnosis not present

## 2019-12-05 DIAGNOSIS — Z4801 Encounter for change or removal of surgical wound dressing: Secondary | ICD-10-CM | POA: Diagnosis not present

## 2019-12-05 DIAGNOSIS — S61219D Laceration without foreign body of unspecified finger without damage to nail, subsequent encounter: Secondary | ICD-10-CM | POA: Diagnosis not present

## 2019-12-05 DIAGNOSIS — Z4800 Encounter for change or removal of nonsurgical wound dressing: Secondary | ICD-10-CM | POA: Diagnosis not present

## 2019-12-07 DIAGNOSIS — S61217D Laceration without foreign body of left little finger without damage to nail, subsequent encounter: Secondary | ICD-10-CM | POA: Diagnosis not present

## 2019-12-07 DIAGNOSIS — Z4800 Encounter for change or removal of nonsurgical wound dressing: Secondary | ICD-10-CM | POA: Diagnosis not present

## 2019-12-07 DIAGNOSIS — Z4801 Encounter for change or removal of surgical wound dressing: Secondary | ICD-10-CM | POA: Diagnosis not present

## 2019-12-10 DIAGNOSIS — M25511 Pain in right shoulder: Secondary | ICD-10-CM | POA: Diagnosis not present

## 2019-12-14 DIAGNOSIS — S61217D Laceration without foreign body of left little finger without damage to nail, subsequent encounter: Secondary | ICD-10-CM | POA: Diagnosis not present

## 2019-12-14 DIAGNOSIS — Z4802 Encounter for removal of sutures: Secondary | ICD-10-CM | POA: Diagnosis not present

## 2019-12-17 DIAGNOSIS — M25511 Pain in right shoulder: Secondary | ICD-10-CM | POA: Diagnosis not present

## 2019-12-17 DIAGNOSIS — S46091D Other injury of muscle(s) and tendon(s) of the rotator cuff of right shoulder, subsequent encounter: Secondary | ICD-10-CM | POA: Diagnosis not present

## 2019-12-17 DIAGNOSIS — M7541 Impingement syndrome of right shoulder: Secondary | ICD-10-CM | POA: Diagnosis not present

## 2019-12-27 DIAGNOSIS — Z0001 Encounter for general adult medical examination with abnormal findings: Secondary | ICD-10-CM | POA: Diagnosis not present

## 2019-12-27 DIAGNOSIS — R739 Hyperglycemia, unspecified: Secondary | ICD-10-CM | POA: Diagnosis not present

## 2019-12-27 DIAGNOSIS — Z6830 Body mass index (BMI) 30.0-30.9, adult: Secondary | ICD-10-CM | POA: Diagnosis not present

## 2019-12-27 DIAGNOSIS — Z6831 Body mass index (BMI) 31.0-31.9, adult: Secondary | ICD-10-CM | POA: Diagnosis not present

## 2019-12-27 DIAGNOSIS — M7501 Adhesive capsulitis of right shoulder: Secondary | ICD-10-CM | POA: Diagnosis not present

## 2019-12-27 DIAGNOSIS — M25511 Pain in right shoulder: Secondary | ICD-10-CM | POA: Diagnosis not present

## 2020-01-02 DIAGNOSIS — K219 Gastro-esophageal reflux disease without esophagitis: Secondary | ICD-10-CM | POA: Diagnosis not present

## 2020-01-02 DIAGNOSIS — M9979 Connective tissue and disc stenosis of intervertebral foramina of abdomen and other regions: Secondary | ICD-10-CM | POA: Diagnosis not present

## 2020-01-02 DIAGNOSIS — E1165 Type 2 diabetes mellitus with hyperglycemia: Secondary | ICD-10-CM | POA: Diagnosis not present

## 2020-01-29 DIAGNOSIS — F419 Anxiety disorder, unspecified: Secondary | ICD-10-CM | POA: Diagnosis not present

## 2020-01-29 DIAGNOSIS — G4700 Insomnia, unspecified: Secondary | ICD-10-CM | POA: Diagnosis not present

## 2020-01-29 DIAGNOSIS — K5900 Constipation, unspecified: Secondary | ICD-10-CM | POA: Diagnosis not present

## 2020-01-29 DIAGNOSIS — M25559 Pain in unspecified hip: Secondary | ICD-10-CM | POA: Diagnosis not present

## 2020-01-29 DIAGNOSIS — K219 Gastro-esophageal reflux disease without esophagitis: Secondary | ICD-10-CM | POA: Diagnosis not present

## 2020-01-29 DIAGNOSIS — M5416 Radiculopathy, lumbar region: Secondary | ICD-10-CM | POA: Diagnosis not present

## 2020-01-29 DIAGNOSIS — Z79891 Long term (current) use of opiate analgesic: Secondary | ICD-10-CM | POA: Diagnosis not present

## 2020-01-29 DIAGNOSIS — M545 Low back pain: Secondary | ICD-10-CM | POA: Diagnosis not present

## 2020-01-29 DIAGNOSIS — M25519 Pain in unspecified shoulder: Secondary | ICD-10-CM | POA: Diagnosis not present

## 2020-02-08 NOTE — Patient Instructions (Addendum)
DUE TO COVID-19 ONLY ONE VISITOR IS ALLOWED TO COME WITH YOU AND STAY IN THE WAITING ROOM ONLY DURING PRE OP AND PROCEDURE DAY OF SURGERY. THE 1 VISITOR MAY VISIT WITH YOU AFTER SURGERY IN YOUR PRIVATE ROOM DURING VISITING HOURS ONLY!  YOU NEED TO HAVE A COVID 19 TEST ON__8/9_____ @_12 :55______, THIS TEST MUST BE DONE BEFORE SURGERY,  COVID TESTING SITE [place] [address], IT IS ON THE RIGHT GOING OUT [address] APPROXIMATELY  2 MINUTES PAST [employer] ON THE RIGHT. ONCE YOUR COVID TEST IS COMPLETED,  PLEASE BEGIN THE QUARANTINE INSTRUCTIONS AS OUTLINED IN YOUR HANDOUT.                Steven Richard    Your procedure is scheduled on: 02/14/20   [facility]   Report to admitting at   12:25 PM     Call this number if you have problems the morning of surgery [phone]    Remember: Do not eat food After Midnight.     At 8:00 AM drink pre surgery drink. Then Nothing by mouth after 8:00 AM.             You may have clear liquid until 8:00 am    CLEAR LIQUID DIET   Foods Allowed                                                                     Foods Excluded  Coffee and tea, regular and decaf                             liquids that you cannot  Plain Jell-O any favor except red or purple             see through such as: Fruit ices (not with fruit pulp)                                     milk, soups, orange juice  Iced Popsicles                                    All solid food Carbonated beverages, regular and diet                                     apple juices Sports drinks like Gatorade Lightly seasoned clear broth or consume Sugar, honey syrup   BRUSH YOUR TEETH MORNING OF SURGERY AND RINSE YOUR MOUTH OUT, NO CHEWING GUM CANDY OR MINTS.     Take these medicines the morning of surgery with A SIP OF WATER: Dexilant,   Paxil, Percocet, Lyrica use your eye drops                                  You may not have any metal on your body including  piercings               Do not wear jewelry,  lotions, powders or deodorant              Men may shave face and neck.   Do not bring valuables to the hospital. [name].  Contacts, dentures or bridgework may not be worn into surgery.      Patients discharged the day of surgery will not be allowed to drive home.   IF YOU ARE HAVING SURGERY AND GOING HOME THE SAME DAY, YOU MUST HAVE AN ADULT TO DRIVE YOU HOME AND BE WITH YOU FOR 24 HOURS.   YOU MAY GO HOME BY TAXI OR UBER OR ORTHERWISE, BUT AN ADULT MUST ACCOMPANY YOU HOME AND STAY WITH YOU FOR 24 HOURS.  Name and phone number of your driver:  Special Instructions: N/A              Please read over the following fact sheets you were given: _____________________________________________________________________  [facility]- Preparing for Total Shoulder Arthroplasty    Before surgery, you can play an important role. Because skin is not sterile, your skin needs to be as free of germs as possible. You can reduce the number of germs on your skin by using the following products. . Benzoyl Peroxide Gel o Reduces the number of germs present on the skin o Applied twice a day to shoulder area starting two days before surgery    ==================================================================  Please follow these instructions carefully:  BENZOYL PEROXIDE 5% GEL  Please do not use if you have an allergy to benzoyl peroxide.   If your skin becomes reddened/irritated stop using the benzoyl peroxide.  Starting two days before surgery, apply as follows: 1. Apply benzoyl peroxide in the morning and at night. Apply after taking a shower. If you are not taking a shower clean entire shoulder front, back, and side along with the armpit with a clean wet washcloth.  2. Place a quarter-sized dollop on your shoulder and rub in thoroughly,  making sure to cover the front, back, and side of your shoulder, along with the armpit.   2 days before ____ AM   ____ PM              1 day before ____ AM   ____ PM                         3. Do this twice a day for two days.  (Last application is the night before surgery, AFTER using the CHG soap as described below).  4. Do NOT apply benzoyl peroxide gel on the day of surgery.            [facility] - Preparing for Surgery  Before surgery, you can play an important role.   Because skin is not sterile, your skin needs to be as free of germs as possible.   You can reduce the number of germs on your skin by washing with CHG (chlorahexidine gluconate) soap before surgery.   CHG is an antiseptic cleaner which kills germs and bonds with the skin to continue killing germs even after washing. Please DO NOT use if you have an allergy to CHG or antibacterial soaps.  If your skin becomes reddened/irritated stop using the CHG and inform your nurse when you arrive  at Short Stay. .  You may shave your face/neck.  Please follow these instructions carefully:  1.  Shower with CHG Soap the night before surgery and the  morning of Surgery.  2.  If you choose to wash your hair, wash your hair first as usual with your  normal  shampoo.  3.  After you shampoo, rinse your hair and body thoroughly to remove the  shampoo.                                        4.  Use CHG as you would any other liquid soap.  You can apply chg directly  to the skin and wash                       Gently with a scrungie or clean washcloth.  5.  Apply the CHG Soap to your body ONLY FROM THE NECK DOWN.   Do not use on face/ open                           Wound or open sores. Avoid contact with eyes, ears mouth and genitals (private parts).                       Wash face,  Genitals (private parts) with your normal soap.             6.  Wash thoroughly, paying special attention to the area where your surgery  will be performed.  7.   Thoroughly rinse your body with warm water from the neck down.  8.  DO NOT shower/wash with your normal soap after using and rinsing off  the CHG Soap.             9.  Pat yourself dry with a clean towel.            10.  Wear clean pajamas.            11.  Place clean sheets on your bed the night of your first shower and do not  sleep with pets. Day of Surgery : Do not apply any lotions/deodorants the morning of surgery.  Please wear clean clothes to the hospital/surgery center.     Incentive Spirometer  An incentive spirometer is a tool that can help keep your lungs clear and active. This tool measures how well you are filling your lungs with each breath. Taking long deep breaths may help reverse or decrease the chance of developing breathing (pulmonary) problems (especially infection) following:  A long period of time when you are unable to move or be active. BEFORE THE PROCEDURE   If the spirometer includes an indicator to show your best effort, your nurse or respiratory therapist will set it to a desired goal.  If possible, sit up straight or lean slightly forward. Try not to slouch.  Hold the incentive spirometer in an upright position. INSTRUCTIONS FOR USE  1. Sit on the edge of your bed if possible, or sit up as far as you can in bed or on a chair. 2. Hold the incentive spirometer in an upright position. 3. Breathe out normally. 4. Place the mouthpiece in your mouth and seal your lips tightly around it. 5. Breathe in slowly and as deeply as possible, raising the piston or  the ball toward the top of the column. 6. Hold your breath for 3-5 seconds or for as long as possible. Allow the piston or ball to fall to the bottom of the column. 7. Remove the mouthpiece from your mouth and breathe out normally. 8. Rest for a few seconds and repeat Steps 1 through 7 at least 10 times every 1-2 hours when you are awake. Take your time and take a few normal breaths between deep  breaths. 9. The spirometer may include an indicator to show your best effort. Use the indicator as a goal to work toward during each repetition. 10. After each set of 10 deep breaths, practice coughing to be sure your lungs are clear. If you have an incision (the cut made at the time of surgery), support your incision when coughing by placing a pillow or rolled up towels firmly against it. Once you are able to get out of bed, walk around indoors and cough well. You may stop using the incentive spirometer when instructed by your caregiver.  RISKS AND COMPLICATIONS  Take your time so you do not get dizzy or light-headed.  If you are in pain, you may need to take or ask for pain medication before doing incentive spirometry. It is harder to take a deep breath if you are having pain. AFTER USE  Rest and breathe slowly and easily.  It can be helpful to keep track of a log of your progress. Your caregiver can provide you with a simple table to help with this. If you are using the spirometer at home, follow these instructions: [place] IF:   You are having difficultly using the spirometer.  You have trouble using the spirometer as often as instructed.  Your pain medication is not giving enough relief while using the spirometer.  You develop fever of 100.5 F (38.1 C) or higher. SEEK IMMEDIATE MEDICAL CARE IF:   You cough up bloody sputum that had not been present before.  You develop fever of 102 F (38.9 C) or greater.  You develop worsening pain at or near the incision site. MAKE SURE YOU:   Understand these instructions.  Will watch your condition.  Will get help right away if you are not doing well or get worse. Document Released: 11/01/2006 Document Revised: 09/13/2011 Document Reviewed: 01/02/2007 ExitCare Patient Information 2014 [place].   ________________________________________________________________________   FAILURE TO FOLLOW THESE INSTRUCTIONS MAY  RESULT IN THE CANCELLATION OF YOUR SURGERY PATIENT SIGNATURE_________________________________  NURSE SIGNATURE__________________________________  ________________________________________________________________________

## 2020-02-11 ENCOUNTER — Other Ambulatory Visit: Payer: Self-pay

## 2020-02-11 ENCOUNTER — Encounter (HOSPITAL_COMMUNITY): Payer: Self-pay

## 2020-02-11 ENCOUNTER — Encounter (HOSPITAL_COMMUNITY)
Admission: RE | Admit: 2020-02-11 | Discharge: 2020-02-11 | Disposition: A | Discharge status: Home / Self Care | Payer: Medicare HMO | Source: Ambulatory Visit | Attending: Orthopedic Surgery | Admitting: Orthopedic Surgery

## 2020-02-11 ENCOUNTER — Other Ambulatory Visit (HOSPITAL_COMMUNITY)
Admission: RE | Admit: 2020-02-11 | Discharge: 2020-02-11 | Disposition: A | Discharge status: Home / Self Care | Payer: Medicare HMO | Source: Ambulatory Visit | Attending: Orthopedic Surgery | Admitting: Orthopedic Surgery

## 2020-02-11 DIAGNOSIS — Z01812 Encounter for preprocedural laboratory examination: Secondary | ICD-10-CM | POA: Diagnosis not present

## 2020-02-11 DIAGNOSIS — Z20822 Contact with and (suspected) exposure to covid-19: Secondary | ICD-10-CM | POA: Diagnosis not present

## 2020-02-11 LAB — CBC
HCT: 46.4 % (ref 39.0–52.0)
Hemoglobin: 14.8 g/dL (ref 13.0–17.0)
MCH: 28.6 pg (ref 26.0–34.0)
MCHC: 31.9 g/dL (ref 30.0–36.0)
MCV: 89.6 fL (ref 80.0–100.0)
Platelets: 233 10*3/uL (ref 150–400)
RBC: 5.18 MIL/uL (ref 4.22–5.81)
RDW: 13.2 % (ref 11.5–15.5)
WBC: 7.6 10*3/uL (ref 4.0–10.5)
nRBC: 0 % (ref 0.0–0.2)

## 2020-02-11 LAB — SARS CORONAVIRUS 2 (TAT 6-24 HRS): SARS Coronavirus 2: NEGATIVE

## 2020-02-11 NOTE — Progress Notes (Signed)
COVID Vaccine Completed:No Date COVID Vaccine completed: COVID vaccine manufacturer: Pfizer    Moderna   [employer]   PCP - Dr. Lizbeth Bark Cardiologist - no  Chest x-ray - no EKG - no Stress Test - no ECHO - no Cardiac Cath - no  Sleep Study - yes CPAP - no  Fasting Blood Sugar - NA Checks Blood Sugar _____ times a day  Blood Thinner Instructions:NA Aspirin Instructions: Last Dose:  Anesthesia review:   Patient denies shortness of breath, fever, cough and chest pain at PAT appointment yes   Patient verbalized understanding of instructions that were given to them at the PAT appointment. Patient was also instructed that they will need to review over the PAT instructions again at home before surgery. Yes  Pt is active outdoors and has no SOB doing work or with ADLs. He does take percocet every day for back pain from a work accident years ago.

## 2020-02-14 ENCOUNTER — Ambulatory Visit (HOSPITAL_COMMUNITY): Payer: Medicare HMO | Admitting: Anesthesiology

## 2020-02-14 ENCOUNTER — Encounter (HOSPITAL_COMMUNITY)
Admission: RE | Disposition: A | Discharge status: Home / Self Care | Payer: Self-pay | Source: Home / Self Care | Attending: Orthopedic Surgery

## 2020-02-14 ENCOUNTER — Ambulatory Visit (HOSPITAL_COMMUNITY)
Admission: RE | Admit: 2020-02-14 | Discharge: 2020-02-14 | Disposition: A | Discharge status: Home / Self Care | Payer: Medicare HMO | Attending: Orthopedic Surgery | Admitting: Orthopedic Surgery

## 2020-02-14 ENCOUNTER — Encounter (HOSPITAL_COMMUNITY): Payer: Self-pay | Admitting: Orthopedic Surgery

## 2020-02-14 DIAGNOSIS — S46111A Strain of muscle, fascia and tendon of long head of biceps, right arm, initial encounter: Secondary | ICD-10-CM | POA: Diagnosis not present

## 2020-02-14 DIAGNOSIS — M7541 Impingement syndrome of right shoulder: Secondary | ICD-10-CM | POA: Diagnosis not present

## 2020-02-14 DIAGNOSIS — X58XXXA Exposure to other specified factors, initial encounter: Secondary | ICD-10-CM | POA: Insufficient documentation

## 2020-02-14 DIAGNOSIS — M25511 Pain in right shoulder: Secondary | ICD-10-CM | POA: Diagnosis not present

## 2020-02-14 DIAGNOSIS — Z87891 Personal history of nicotine dependence: Secondary | ICD-10-CM | POA: Diagnosis not present

## 2020-02-14 DIAGNOSIS — M75101 Unspecified rotator cuff tear or rupture of right shoulder, not specified as traumatic: Secondary | ICD-10-CM

## 2020-02-14 DIAGNOSIS — K219 Gastro-esophageal reflux disease without esophagitis: Secondary | ICD-10-CM | POA: Diagnosis not present

## 2020-02-14 DIAGNOSIS — H409 Unspecified glaucoma: Secondary | ICD-10-CM | POA: Insufficient documentation

## 2020-02-14 DIAGNOSIS — Z79899 Other long term (current) drug therapy: Secondary | ICD-10-CM | POA: Insufficient documentation

## 2020-02-14 DIAGNOSIS — M75121 Complete rotator cuff tear or rupture of right shoulder, not specified as traumatic: Secondary | ICD-10-CM | POA: Diagnosis not present

## 2020-02-14 DIAGNOSIS — G8929 Other chronic pain: Secondary | ICD-10-CM | POA: Insufficient documentation

## 2020-02-14 DIAGNOSIS — S43491A Other sprain of right shoulder joint, initial encounter: Secondary | ICD-10-CM | POA: Diagnosis not present

## 2020-02-14 DIAGNOSIS — G8918 Other acute postprocedural pain: Secondary | ICD-10-CM | POA: Diagnosis not present

## 2020-02-14 DIAGNOSIS — Z791 Long term (current) use of non-steroidal anti-inflammatories (NSAID): Secondary | ICD-10-CM | POA: Insufficient documentation

## 2020-02-14 DIAGNOSIS — M7551 Bursitis of right shoulder: Secondary | ICD-10-CM | POA: Diagnosis not present

## 2020-02-14 DIAGNOSIS — I739 Peripheral vascular disease, unspecified: Secondary | ICD-10-CM | POA: Diagnosis not present

## 2020-02-14 DIAGNOSIS — M549 Dorsalgia, unspecified: Secondary | ICD-10-CM | POA: Insufficient documentation

## 2020-02-14 DIAGNOSIS — S43431A Superior glenoid labrum lesion of right shoulder, initial encounter: Secondary | ICD-10-CM | POA: Diagnosis not present

## 2020-02-14 DIAGNOSIS — M19011 Primary osteoarthritis, right shoulder: Secondary | ICD-10-CM | POA: Diagnosis not present

## 2020-02-14 DIAGNOSIS — I890 Lymphedema, not elsewhere classified: Secondary | ICD-10-CM | POA: Diagnosis not present

## 2020-02-14 HISTORY — PX: SHOULDER ARTHROSCOPY WITH ROTATOR CUFF REPAIR: SHX5685

## 2020-02-14 SURGERY — ARTHROSCOPY, SHOULDER, WITH ROTATOR CUFF REPAIR
Anesthesia: General | Site: Shoulder | Laterality: Right

## 2020-02-14 MED ORDER — MIDAZOLAM HCL 2 MG/2ML IJ SOLN
1.0000 mg | INTRAMUSCULAR | Status: DC
  Administered 2020-02-14: 1 mg via INTRAVENOUS
  Filled 2020-02-14: qty 2

## 2020-02-14 MED ORDER — BUPIVACAINE LIPOSOME 1.3 % IJ SUSP
INTRAMUSCULAR | Status: DC | PRN
  Administered 2020-02-14: 10 mL via PERINEURAL

## 2020-02-14 MED ORDER — PHENYLEPHRINE HCL-NACL 10-0.9 MG/250ML-% IV SOLN
INTRAVENOUS | Status: DC | PRN
  Administered 2020-02-14: 20 ug/min via INTRAVENOUS

## 2020-02-14 MED ORDER — FENTANYL CITRATE (PF) 100 MCG/2ML IJ SOLN
INTRAMUSCULAR | Status: AC
  Filled 2020-02-14: qty 2

## 2020-02-14 MED ORDER — LACTATED RINGERS IV SOLN: INTRAVENOUS | Status: DC

## 2020-02-14 MED ORDER — ONDANSETRON HCL 4 MG PO TABS: 4.0000 mg | ORAL_TABLET | Freq: Three times a day (TID) | ORAL | 0 refills | Status: AC | PRN

## 2020-02-14 MED ORDER — SODIUM CHLORIDE 0.9 % IR SOLN
Status: DC | PRN
  Administered 2020-02-14 (×2): 3000 mL

## 2020-02-14 MED ORDER — CEFAZOLIN SODIUM-DEXTROSE 2-4 GM/100ML-% IV SOLN
2.0000 g | INTRAVENOUS | Status: AC
  Administered 2020-02-14: 2 g via INTRAVENOUS
  Filled 2020-02-14: qty 100

## 2020-02-14 MED ORDER — FENTANYL CITRATE (PF) 100 MCG/2ML IJ SOLN
INTRAMUSCULAR | Status: DC | PRN
  Administered 2020-02-14: 50 ug via INTRAVENOUS
  Administered 2020-02-14: 12.5 ug via INTRAVENOUS
  Administered 2020-02-14: 50 ug via INTRAVENOUS

## 2020-02-14 MED ORDER — BUPIVACAINE-EPINEPHRINE (PF) 0.5% -1:200000 IJ SOLN
INTRAMUSCULAR | Status: DC | PRN
  Administered 2020-02-14: 15 mL via PERINEURAL

## 2020-02-14 MED ORDER — HYDROMORPHONE HCL 1 MG/ML IJ SOLN: 0.2500 mg | INTRAMUSCULAR | Status: DC | PRN

## 2020-02-14 MED ORDER — ORAL CARE MOUTH RINSE: 15.0000 mL | Freq: Once | OROMUCOSAL | Status: AC

## 2020-02-14 MED ORDER — CHLORHEXIDINE GLUCONATE 0.12 % MT SOLN
15.0000 mL | Freq: Once | OROMUCOSAL | Status: AC
  Administered 2020-02-14: 15 mL via OROMUCOSAL

## 2020-02-14 MED ORDER — CYCLOBENZAPRINE HCL 10 MG PO TABS
10.0000 mg | ORAL_TABLET | Freq: Three times a day (TID) | ORAL | 1 refills | Status: AC | PRN
Start: 2020-02-14 — End: ?

## 2020-02-14 MED ORDER — HYDROMORPHONE HCL 2 MG PO TABS: 2.0000 mg | ORAL_TABLET | ORAL | 0 refills | Status: AC | PRN

## 2020-02-14 MED ORDER — MELOXICAM 15 MG PO TABS: 15.0000 mg | ORAL_TABLET | Freq: Every day | ORAL | 1 refills | Status: DC

## 2020-02-14 MED ORDER — DEXAMETHASONE SODIUM PHOSPHATE 10 MG/ML IJ SOLN
INTRAMUSCULAR | Status: DC | PRN
  Administered 2020-02-14: 10 mg via INTRAVENOUS

## 2020-02-14 MED ORDER — MIDAZOLAM HCL 5 MG/5ML IJ SOLN
INTRAMUSCULAR | Status: DC | PRN
  Administered 2020-02-14: 2 mg via INTRAVENOUS

## 2020-02-14 MED ORDER — FENTANYL CITRATE (PF) 100 MCG/2ML IJ SOLN
50.0000 ug | INTRAMUSCULAR | Status: DC
  Administered 2020-02-14: 100 ug via INTRAVENOUS
  Filled 2020-02-14: qty 2

## 2020-02-14 MED ORDER — PROPOFOL 10 MG/ML IV BOLUS
INTRAVENOUS | Status: DC | PRN
  Administered 2020-02-14: 120 mg via INTRAVENOUS

## 2020-02-14 MED ORDER — EPHEDRINE SULFATE-NACL 50-0.9 MG/10ML-% IV SOSY
PREFILLED_SYRINGE | INTRAVENOUS | Status: DC | PRN
  Administered 2020-02-14 (×2): 10 mg via INTRAVENOUS
  Administered 2020-02-14 (×2): 5 mg via INTRAVENOUS
  Administered 2020-02-14 (×2): 10 mg via INTRAVENOUS

## 2020-02-14 MED ORDER — ROCURONIUM BROMIDE 10 MG/ML (PF) SYRINGE
PREFILLED_SYRINGE | INTRAVENOUS | Status: DC | PRN
  Administered 2020-02-14: 60 mg via INTRAVENOUS
  Administered 2020-02-14: 10 mg via INTRAVENOUS

## 2020-02-14 MED ORDER — ACETAMINOPHEN 500 MG PO TABS
1000.0000 mg | ORAL_TABLET | Freq: Once | ORAL | Status: AC
  Administered 2020-02-14: 1000 mg via ORAL
  Filled 2020-02-14: qty 2

## 2020-02-14 MED ORDER — LIDOCAINE 2% (20 MG/ML) 5 ML SYRINGE
INTRAMUSCULAR | Status: DC | PRN
  Administered 2020-02-14: 20 mg via INTRAVENOUS

## 2020-02-14 MED ORDER — MIDAZOLAM HCL 2 MG/2ML IJ SOLN
INTRAMUSCULAR | Status: AC
  Filled 2020-02-14: qty 2

## 2020-02-14 SURGICAL SUPPLY — 60 items
ANCHOR PEEK 4.75X19.1 SWLK C (Anchor) ×9 IMPLANT
BLADE EXCALIBUR 4.0MM X 13CM (MISCELLANEOUS) ×1
BLADE EXCALIBUR 4.0X13 (MISCELLANEOUS) ×2 IMPLANT
BOOTIES KNEE HIGH SLOAN (MISCELLANEOUS) ×6 IMPLANT
BURR OVAL 8 FLU 4.0MM X 13CM (MISCELLANEOUS) ×1
BURR OVAL 8 FLU 4.0X13 (MISCELLANEOUS) ×2 IMPLANT
BURR OVAL 8 FLU 5.0MM X 13CM (MISCELLANEOUS)
BURR OVAL 8 FLU 5.0X13 (MISCELLANEOUS) IMPLANT
CANNULA ACUFLEX KIT 5X76 (CANNULA) ×3 IMPLANT
CANNULA DRILOCK 5.0MMX75MM (CANNULA) ×1
CANNULA DRILOCK 5.0X75 (CANNULA) ×2 IMPLANT
CANNULA TWIST IN 8.25X7CM (CANNULA) ×3 IMPLANT
CLOSURE STERI-STRIP 1/2X4 (GAUZE/BANDAGES/DRESSINGS) ×1
CLOSURE WOUND 1/2 X4 (GAUZE/BANDAGES/DRESSINGS) ×1
CLSR STERI-STRIP ANTIMIC 1/2X4 (GAUZE/BANDAGES/DRESSINGS) ×2 IMPLANT
CONNECTOR 5 IN 1 STRAIGHT STRL (MISCELLANEOUS) ×3 IMPLANT
COOLER ICEMAN CLASSIC (MISCELLANEOUS) IMPLANT
COVER WAND RF STERILE (DRAPES) ×3 IMPLANT
DISSECTOR  3.8MM X 13CM (MISCELLANEOUS) ×3
DISSECTOR 3.8MM X 13CM (MISCELLANEOUS) ×1 IMPLANT
DRAPE INCISE 23X17 IOBAN STRL (DRAPES) ×2
DRAPE INCISE IOBAN 23X17 STRL (DRAPES) ×1 IMPLANT
DRAPE INCISE IOBAN 66X45 STRL (DRAPES) ×3 IMPLANT
DRAPE STERI 35X30 U-POUCH (DRAPES) ×3 IMPLANT
DRAPE SURG 17X11 SM STRL (DRAPES) ×3 IMPLANT
DRAPE U-SHAPE 47X51 STRL (DRAPES) ×3 IMPLANT
DRSG PAD ABDOMINAL 8X10 ST (GAUZE/BANDAGES/DRESSINGS) ×6 IMPLANT
DURAPREP 26ML APPLICATOR (WOUND CARE) ×3 IMPLANT
FIBER TAPE 2MM (SUTURE) ×3 IMPLANT
GAUZE SPONGE 4X4 12PLY STRL (GAUZE/BANDAGES/DRESSINGS) ×3 IMPLANT
GLOVE BIO SURGEON STRL SZ7.5 (GLOVE) ×3 IMPLANT
GLOVE BIO SURGEON STRL SZ8 (GLOVE) ×3 IMPLANT
GLOVE SS BIOGEL STRL SZ 7 (GLOVE) ×2 IMPLANT
GLOVE SS BIOGEL STRL SZ 7.5 (GLOVE) ×1 IMPLANT
GLOVE SUPERSENSE BIOGEL SZ 7 (GLOVE) ×4
GLOVE SUPERSENSE BIOGEL SZ 7.5 (GLOVE) ×2
GOWN STRL REUS W/TWL LRG LVL3 (GOWN DISPOSABLE) ×6 IMPLANT
KIT BASIN OR (CUSTOM PROCEDURE TRAY) ×3 IMPLANT
KIT SHOULDER TRACTION (DRAPES) ×3 IMPLANT
KIT TURNOVER KIT A (KITS) IMPLANT
MANIFOLD NEPTUNE II (INSTRUMENTS) ×3 IMPLANT
NEEDLE SCORPION MULTI FIRE (NEEDLE) ×3 IMPLANT
NS IRRIG 1000ML POUR BTL (IV SOLUTION) ×3 IMPLANT
PACK ARTHROSCOPY WL (CUSTOM PROCEDURE TRAY) ×3 IMPLANT
PAD ARMBOARD 7.5X6 YLW CONV (MISCELLANEOUS) ×3 IMPLANT
PAD COLD SHLDR WRAP-ON (PAD) IMPLANT
PROBE APOLLO 90XL (SURGICAL WAND) ×3 IMPLANT
SLING ARM FOAM STRAP MED (SOFTGOODS) IMPLANT
SPONGE LAP 4X18 RFD (DISPOSABLE) ×3 IMPLANT
STRIP CLOSURE SKIN 1/2X4 (GAUZE/BANDAGES/DRESSINGS) ×2 IMPLANT
SUT FIBERWIRE #2 38 T-5 BLUE (SUTURE)
SUT MNCRL AB 3-0 PS2 18 (SUTURE) ×3 IMPLANT
SUT PDS AB 0 CT 36 (SUTURE) IMPLANT
SUT TIGER TAPE 7 IN WHITE (SUTURE) ×3 IMPLANT
SUTURE FIBERWR #2 38 T-5 BLUE (SUTURE) IMPLANT
TAPE FIBER 2MM 7IN #2 BLUE (SUTURE) IMPLANT
TAPE PAPER 3X10 WHT MICROPORE (GAUZE/BANDAGES/DRESSINGS) ×3 IMPLANT
TOWEL OR 17X26 10 PK STRL BLUE (TOWEL DISPOSABLE) ×3 IMPLANT
TUBING ARTHROSCOPY IRRIG 16FT (MISCELLANEOUS) ×3 IMPLANT
WATER STERILE IRR 1000ML POUR (IV SOLUTION) ×3 IMPLANT

## 2020-02-14 NOTE — Transfer of Care (Signed)
Immediate Anesthesia Transfer of Care Note  Patient: Steven Richard  Procedure(s) Performed: Right Shoulder arthroscopy, subacromial decompression, distal clavicle resection rotator cuff repair (Right Shoulder)  Patient Location: PACU  Anesthesia Type:GA combined with regional for post-op pain  Level of Consciousness: awake, oriented, drowsy and responds to stimulation  Airway & Oxygen Therapy: Patient Spontanous Breathing and Patient connected to face mask oxygen  Post-op Assessment: Report given to RN and Post -op Vital signs reviewed and stable  Post vital signs: Reviewed and stable  Last Vitals:  Vitals Value Taken Time  BP 149/73 02/14/20 1519  Temp    Pulse 63 02/14/20 1521  Resp 23 02/14/20 1521  SpO2 95 % 02/14/20 1521  Vitals shown include unvalidated device data.  Last Pain:  Vitals:   02/14/20 1253  TempSrc:   PainSc: 0-No pain      Patients Stated Pain Goal: 6 (53/64/68 0321)  Complications: No complications documented.

## 2020-02-14 NOTE — Anesthesia Procedure Notes (Signed)
Anesthesia Regional Block: Interscalene brachial plexus block   Pre-Anesthetic Checklist: ,, timeout performed, Correct Patient, Correct Site, Correct Laterality, Correct Procedure, Correct Position, site marked, Risks and benefits discussed, pre-op evaluation,  At surgeon's request and post-op pain management  Laterality: Right  Prep: Maximum Sterile Barrier Precautions used, chloraprep       Needles:  Injection technique: Single-shot  Needle Type: Echogenic Stimulator Needle     Needle Length: 5cm  Needle Gauge: 22     Additional Needles:   Procedures:,,,, ultrasound used (permanent image in chart),,,,  Narrative:  Start time: 02/14/2020 12:41 PM End time: 02/14/2020 12:51 PM Injection made incrementally with aspirations every 5 mL. Anesthesiologist: Roderic Palau, MD  Additional Notes: 2% Lidocaine skin wheel.

## 2020-02-14 NOTE — Op Note (Signed)
02/14/2020  3:02 PM  PATIENT:   Steven Richard  64 y.o. male  PRE-OPERATIVE DIAGNOSIS:  Right shoulder impingement, acromioclavicular osteoarthritis, rotator cuff tear  POST-OPERATIVE DIAGNOSIS: Same with the additional finding of a degenerative labral tear and partial biceps tendon tear  PROCEDURE:  1.  Right shoulder examination under anesthesia.  2.  Right shoulder glenohumeral joint diagnostic arthroscopy  3.  Debridement of extensive degenerative labral tear  4.  Debridement of partial biceps tendon tear  5.  Arthroscopic subacromial decompression and bursectomy  6.  Arthroscopic distal clavicle resection  7.  Arthroscopic rotator cuff repair using a double row suture bridge repair construct.  SURGEON:  Marin Shutter M.D.  ASSISTANTS: Jenetta Loges, PA-C  ANESTHESIA:   General endotracheal and interscalene block with Exparel  EBL: Minimal  SPECIMEN: None  Drains: None   PATIENT DISPOSITION:  PACU - hemodynamically stable.    PLAN OF CARE: Discharge to home after PACU  Brief history:  Steven Richard is a 64 year old gentleman who has had chronic and progressively increasing right shoulder pain related to an impingement syndrome with recent MRI scan confirming a full-thickness rotator cuff tear.  Due to his increasing pain and functional rotations and failure to respond to conservative management he is brought to the operating this time for planned right shoulder arthroscopy as described below.  Preoperatively, I counseled the patient regarding treatment options and risks versus benefits thereof.  Possible surgical complications were all reviewed including potential for bleeding, infection, neurovascular injury, persistent pain, loss of motion, anesthetic complication, failure of the implant, and possible need for additional surgery. They understand and accept and agrees with our planned procedure.   Procedure detail:  After undergoing routine preop evaluation  patient received prophylactic antibiotics and interscalene block with Exparel established in the holding area by the anesthesia department.  Patient subsequently placed supine on the operating table and underwent the smooth induction of a general endotracheal anesthesia.  Placed into the left lateral decubitus position on the beanbag and appropriately padded and protected.  The right shoulder girdle region was sterilely prepped and draped in standard fashion after mid examination revealed full motion with no instability patterns.  The arm was then suspended at 70 degrees of abduction with 15 pounds of traction in the shoulder girdle was sterilely prepped and draped in standard fashion.  Timeout was called.  A posterior portal was substance glenohumeral joint intraportal established under direct visualization articular surfaces found to be in excellent condition.  Capsular volume was then found to be within normal limits.  No significant synovitis.  Biceps tendon shows some fraying distally which was debrided with a shaver.  The overall caliber and quality of the tendon however was excellent with no evidence for proximal or distal instability.  There was an obvious full-thickness defect of the distal supraspinatus.  Degenerative labral tearing was noted anteriorly and superiorly which was debrided with a shaver.  At this point final irrigation was completed.  Fluid and instruments removed.  The arm was then dropped down to 30 degrees of abduction with the arthroscope introduced in the subacromial space of the posterior portal and a direct lateral portal was established in the subacromial space.  Abundant dense bursal tissue multiple adhesions were encountered and these were divided and excised with combination the shaver the Stryker wand.  The wand was then used to remove the periosteum from the undersurface of the anterior half of the acromion then a subacromial decompression was performed with a bur  cardiac type  and morphology.  A portal was then established directly anterior to the distal clavicle and the distal clavicle resection was performed with a bur.  Care was taken to confirm visualization of the entire circumference of the distal clavicle to ensure adequate removal of bone.  We then completed the subacromial/subdeltoid bursectomy.  Evaluation of the rotator cuff confirmed a full-thickness defect involving the distal supraspinatus.  The tear margin was debrided with shaver back to healthy tissue and the greater tuberosity was then prepared removing soft tissue and abrading the bone to bleeding bed.  Ultimately footprint was approximately a centimeter and half in width.  Through a stab wound off the lateral margin of the acromion we placed an Arthrex peek swivel lock suture anchor loaded with a fiber tape and all 4 suture limbs were then shuttled equidistant across the width of the rotator cuff tear using the scorpion suture passer.  These were then passed into 2 Lateral Row anchors in an alternating fashion creating a double row repair which allowed excellent apposition of the rotator cuff margin against the bony bed and tuberosity and the overall construct was much to our satisfaction.  Suture limbs all then clipped.  Final hemostasis was obtained.  Fluid and inserts removed.  The portals were closed with Monocryl and a Steri-Strip.  A dry dressing to about the right shoulder primary was placed into a sling immobilizer and the patient was awakened, extubated, and taken to recovery in stable condition.  Jenetta Loges, PA-C was utilized as an Environmental consultant throughout this case, essential for help with positioning the patient, positioning extremity, tissue manipulation, implantation of the prosthesis, suture management, wound closure, and intraoperative decision-making.  Marin Shutter MD   Contact # [phone]

## 2020-02-14 NOTE — H&P (Signed)
Steven Richard    Chief Complaint: Right shoulder impingement, acromioclavicular osteoarthritis, rotator cuff tear HPI: The patient is a 64 y.o. male with chronic right shoulder pain related to underlying impingement syndrome with MRI scan demonstrating a full-thickness rotator cuff tear.  Due to his ongoing pain and functional rotations and failure to respond to conservative management he is brought to the operating room at this time for planned right shoulder arthroscopy.  Past Medical History:  Diagnosis Date  . Arthritis   . Chronic back pain   . GERD (gastroesophageal reflux disease)   . Glaucoma   . History of kidney stones    history of    Past Surgical History:  Procedure Laterality Date  . CATARACT EXTRACTION W/PHACO Right 06/01/2016   Procedure: CATARACT EXTRACTION PHACO AND INTRAOCULAR LENS PLACEMENT (IOC);  Surgeon: Rutherford Guys, MD;  Location: [facility];  Service: Ophthalmology;  Laterality: Right;  CDE:  4.34  . CATARACT EXTRACTION W/PHACO Left 06/15/2016   Procedure: CATARACT EXTRACTION PHACO AND INTRAOCULAR LENS PLACEMENT (IOC);  Surgeon: Rutherford Guys, MD;  Location: [facility];  Service: Ophthalmology;  Laterality: Left;  CDE: 4.37  . EYE SURGERY    . ORCHIECTOMY Left    ruptured testicle  . TYMPANOPLASTY Right    with pathching    Family History  Problem Relation Age of Onset  . Diabetes Mother   . Dementia Mother   . Diabetes Father     Social History:  reports that he quit smoking about 51 years ago. His smoking use included cigarettes. He has a 5.00 pack-year smoking history. He has never used smokeless tobacco. He reports that he does not drink alcohol and does not use drugs.   Medications Prior to Admission  Medication Sig Dispense Refill  . ALPRAZolam (XANAX) 1 MG tablet Take 1 mg by mouth at bedtime.     . cycloSPORINE (RESTASIS) 0.05 % ophthalmic emulsion Place 1 drop into both eyes 2 (two) times daily.    Marland Kitchen DEXILANT 60 MG capsule Take 60 mg by mouth  daily before breakfast.     . dorzolamide (TRUSOPT) 2 % ophthalmic solution Place 1 drop into both eyes 2 (two) times daily.    Marland Kitchen LINZESS 72 MCG capsule Take 72 mcg by mouth at bedtime.     . meloxicam (MOBIC) 15 MG tablet Take 15 mg by mouth daily.    Marland Kitchen oxyCODONE-acetaminophen (PERCOCET) 10-325 MG tablet Take 1 tablet by mouth 2 (two) times daily.    Marland Kitchen PARoxetine (PAXIL) 20 MG tablet Take 20 mg by mouth at bedtime.     . pregabalin (LYRICA) 150 MG capsule Take 150 mg by mouth in the morning, at noon, and at bedtime.     . Travoprost, BAK Free, (TRAVATAN) 0.004 % SOLN ophthalmic solution Place 1 drop into both eyes at bedtime.    . traZODone (DESYREL) 100 MG tablet Take 100 mg by mouth at bedtime.        Physical Exam: Right shoulder demonstrates painful and guarded motion as noted at his recent office visits.  Markedly positive impingement sign.  Preoperative MRI scan confirms a full-thickness rotator cuff tear.  AC joint arthritis also noted.  Vitals  Temp:  [97.7 F (36.5 C)] 97.7 F (36.5 C) (08/12 1230) Pulse Rate:  [44-73] 56 (08/12 1311) Resp:  [8-17] 16 (08/12 1311) BP: (123-136)/(61-81) 136/71 (08/12 1309) SpO2:  [95 %-100 %] 96 % (08/12 1311) Weight:  [99.8 kg] 99.8 kg (08/12 1206)  Assessment/Plan  Impression:  Right shoulder impingement, acromioclavicular osteoarthritis, rotator cuff tear  Plan of Action: Procedure(s): Right Shoulder arthroscopy, subacromial decompression, distal clavicle resection, rotator cuff repair  Steven Richard 02/14/2020, 1:14 PM Contact # [phone]

## 2020-02-14 NOTE — Progress Notes (Signed)
Assisted Dr. Oren Bracket with right, ultrasound guided, interscalene  block. Side rails up, monitors on throughout procedure. See vital signs in flow sheet. Tolerated Procedure well. exparel given also.

## 2020-02-14 NOTE — Anesthesia Postprocedure Evaluation (Signed)
Anesthesia Post Note  Patient: Steven Richard  Procedure(s) Performed: Right Shoulder arthroscopy, subacromial decompression, distal clavicle resection rotator cuff repair (Right Shoulder)     Patient location during evaluation: PACU Anesthesia Type: General and Regional Level of consciousness: awake and alert Pain management: pain level controlled Vital Signs Assessment: post-procedure vital signs reviewed and stable Respiratory status: spontaneous breathing, nonlabored ventilation and respiratory function stable Cardiovascular status: blood pressure returned to baseline and stable Postop Assessment: no apparent nausea or vomiting Anesthetic complications: no   No complications documented.  Last Vitals:  Vitals:   02/14/20 1545 02/14/20 1600  BP: 132/78 132/80  Pulse: 62 (!) 59  Resp: 15 14  Temp: (!) 35.6 C (!) 36.3 C  SpO2: 90% 93%    Last Pain:  Vitals:   02/14/20 1545  TempSrc:   PainSc: 0-No pain                 Jaelon Gatley,W. EDMOND

## 2020-02-14 NOTE — Anesthesia Preprocedure Evaluation (Addendum)
Anesthesia Evaluation  Patient identified by MRN, date of birth, ID band Patient awake    Reviewed: Allergy & Precautions, H&P , NPO status , Patient's Chart, lab work & pertinent test results  Airway Mallampati: II  TM Distance: >3 FB Neck ROM: Full    Dental no notable dental hx. (+) Edentulous Upper, Edentulous Lower, Dental Advisory Given   Pulmonary neg pulmonary ROS, former smoker,    Pulmonary exam normal breath sounds clear to auscultation       Cardiovascular negative cardio ROS   Rhythm:Regular Rate:Normal     Neuro/Psych negative neurological ROS  negative psych ROS   GI/Hepatic Neg liver ROS, GERD  ,  Endo/Other  negative endocrine ROS  Renal/GU negative Renal ROS  negative genitourinary   Musculoskeletal  (+) Arthritis , Osteoarthritis,    Abdominal   Peds  Hematology negative hematology ROS (+)   Anesthesia Other Findings   Reproductive/Obstetrics negative OB ROS                            Anesthesia Physical Anesthesia Plan  ASA: II  Anesthesia Plan: General   Post-op Pain Management:  Regional for Post-op pain   Induction: Intravenous  PONV Risk Score and Plan: 3 and Ondansetron, Dexamethasone and Midazolam  Airway Management Planned: Oral ETT  Additional Equipment:   Intra-op Plan:   Post-operative Plan: Extubation in OR  Informed Consent: I have reviewed the patients History and Physical, chart, labs and discussed the procedure including the risks, benefits and alternatives for the proposed anesthesia with the patient or authorized representative who has indicated his/her understanding and acceptance.     Dental advisory given  Plan Discussed with: CRNA  Anesthesia Plan Comments:         Anesthesia Quick Evaluation

## 2020-02-14 NOTE — Discharge Instructions (Signed)
° °  Metta Clines. Supple, M.D., F.A.A.O.S. Orthopaedic Surgery Specializing in Arthroscopic and Reconstructive Surgery of the Shoulder [phone] [address] [place], [place] [address] - Fax [phone]  POST-OP SHOULDER ARTHROSCOPIC ROTATOR CUFF  REPAIR INSTRUCTIONS  1. Call the office at [phone] to schedule your first post-op appointment 7-10 days from the date of your surgery.  2. Leave the steri-strips in place over your incisions when performing dressing changes and showering. You may remove your dressings and begin showering 72 hours from surgery. You can expect drainage that is clear to bloody in nature that occasionally will soak through your dressings. If this occurs go ahead and perform a dressing change. The drainage should lessen daily and when there is no drainage from your incisions feel free to go without a dressing.  3. Wear your sling/immobilizer at all times except to perform the exercises below or to occasionally let your arm dangle by your side to stretch your elbow. You also need to sleep in your sling immobilizer until instructed otherwise.  4. Range of motion to your elbow, wrist, and hand are encouraged 3-5 times daily. Exercise to your hand and fingers helps to reduce swelling you may experience.  5. Utilize ice to the shoulder as instructed with the unit  6. You may one-armed drive when safely off of narcotics and muscle relaxants. You may use your hand that is in the sling to support the steering wheel only. However, should it be your right arm that is in the sling it is not to be used for gear shifting in a manual transmission.  7. Pain control following an exparel block  To help control your post-operative pain you received a nerve block  performed with Exparel which is a long acting anesthetic (numbing agent) which can provide pain relief and sensations of numbness (and relief of pain) in the operative shoulder and arm for up to 3 days. Sometimes  it provides mixed relief, meaning you may still have numbness in certain areas of the arm but can still be able to move  parts of that arm, hand, and fingers. We recommend that your prescribed pain medications  be used as needed. We do not feel it is necessary to "pre medicate" and "stay ahead" of pain.  Taking narcotic pain medications when you are not having any pain can lead to unnecessary and potentially dangerous side effects.    8. Pain medications can produce constipation along with their use. If you experience this, the use of an over the counter stool softener or laxative daily is recommended.   9. For additional questions or concerns, please do not hesitate to call the office. If after hours there is an answering service to forward your concerns to the physician on call.  POST-OP EXERCISES  Pendulum Exercises  Perform pendulum exercises while standing and bending at the waist. Support your uninvolved arm on a table or chair and allow your operated arm to hang freely. Make sure to do these exercises passively - not using you shoulder muscle.  Repeat 20 times. Do 3 sessions per day.

## 2020-02-14 NOTE — Anesthesia Procedure Notes (Signed)
Procedure Name: Intubation Date/Time: 02/14/2020 1:59 PM Performed by: Silas [place], CRNA Pre-anesthesia Checklist: Patient identified, Emergency Drugs available, Suction available and Patient being monitored Patient Re-evaluated:Patient Re-evaluated prior to induction Oxygen Delivery Method: Circle system utilized Preoxygenation: Pre-oxygenation with 100% oxygen Induction Type: IV induction Ventilation: Mask ventilation without difficulty and Oral airway inserted - appropriate to patient size Laryngoscope Size: Mac and 4 Grade View: Grade I Tube type: Oral Tube size: 7.5 mm Number of attempts: 1 Airway Equipment and Method: Stylet and Oral airway Placement Confirmation: ETT inserted through vocal cords under direct vision,  positive ETCO2 and breath sounds checked- equal and bilateral Secured at: 22 cm Tube secured with: Tape Dental Injury: Teeth and Oropharynx as per pre-operative assessment

## 2020-02-15 ENCOUNTER — Encounter (HOSPITAL_COMMUNITY): Payer: Self-pay | Admitting: Orthopedic Surgery

## 2020-03-04 DIAGNOSIS — K219 Gastro-esophageal reflux disease without esophagitis: Secondary | ICD-10-CM | POA: Diagnosis not present

## 2020-03-04 DIAGNOSIS — M9979 Connective tissue and disc stenosis of intervertebral foramina of abdomen and other regions: Secondary | ICD-10-CM | POA: Diagnosis not present

## 2020-03-04 DIAGNOSIS — E1165 Type 2 diabetes mellitus with hyperglycemia: Secondary | ICD-10-CM | POA: Diagnosis not present

## 2020-03-24 DIAGNOSIS — H524 Presbyopia: Secondary | ICD-10-CM | POA: Diagnosis not present

## 2020-03-24 DIAGNOSIS — H04123 Dry eye syndrome of bilateral lacrimal glands: Secondary | ICD-10-CM | POA: Diagnosis not present

## 2020-03-24 DIAGNOSIS — H401132 Primary open-angle glaucoma, bilateral, moderate stage: Secondary | ICD-10-CM | POA: Diagnosis not present

## 2020-03-24 DIAGNOSIS — H52203 Unspecified astigmatism, bilateral: Secondary | ICD-10-CM | POA: Diagnosis not present

## 2020-03-24 DIAGNOSIS — Z961 Presence of intraocular lens: Secondary | ICD-10-CM | POA: Diagnosis not present

## 2020-04-02 DIAGNOSIS — H26491 Other secondary cataract, right eye: Secondary | ICD-10-CM | POA: Diagnosis not present

## 2020-04-21 DIAGNOSIS — F419 Anxiety disorder, unspecified: Secondary | ICD-10-CM | POA: Diagnosis not present

## 2020-04-21 DIAGNOSIS — M5416 Radiculopathy, lumbar region: Secondary | ICD-10-CM | POA: Diagnosis not present

## 2020-04-21 DIAGNOSIS — M25519 Pain in unspecified shoulder: Secondary | ICD-10-CM | POA: Diagnosis not present

## 2020-04-21 DIAGNOSIS — K5900 Constipation, unspecified: Secondary | ICD-10-CM | POA: Diagnosis not present

## 2020-04-21 DIAGNOSIS — Z79891 Long term (current) use of opiate analgesic: Secondary | ICD-10-CM | POA: Diagnosis not present

## 2020-04-21 DIAGNOSIS — M25559 Pain in unspecified hip: Secondary | ICD-10-CM | POA: Diagnosis not present

## 2020-04-21 DIAGNOSIS — K219 Gastro-esophageal reflux disease without esophagitis: Secondary | ICD-10-CM | POA: Diagnosis not present

## 2020-04-21 DIAGNOSIS — G4700 Insomnia, unspecified: Secondary | ICD-10-CM | POA: Diagnosis not present

## 2020-04-21 DIAGNOSIS — M545 Low back pain, unspecified: Secondary | ICD-10-CM | POA: Diagnosis not present

## 2020-06-03 DIAGNOSIS — E1165 Type 2 diabetes mellitus with hyperglycemia: Secondary | ICD-10-CM | POA: Diagnosis not present

## 2020-06-03 DIAGNOSIS — M9979 Connective tissue and disc stenosis of intervertebral foramina of abdomen and other regions: Secondary | ICD-10-CM | POA: Diagnosis not present

## 2020-06-03 DIAGNOSIS — K219 Gastro-esophageal reflux disease without esophagitis: Secondary | ICD-10-CM | POA: Diagnosis not present

## 2020-06-16 DIAGNOSIS — G4700 Insomnia, unspecified: Secondary | ICD-10-CM | POA: Diagnosis not present

## 2020-06-16 DIAGNOSIS — M5416 Radiculopathy, lumbar region: Secondary | ICD-10-CM | POA: Diagnosis not present

## 2020-06-16 DIAGNOSIS — M25559 Pain in unspecified hip: Secondary | ICD-10-CM | POA: Diagnosis not present

## 2020-06-16 DIAGNOSIS — M25519 Pain in unspecified shoulder: Secondary | ICD-10-CM | POA: Diagnosis not present

## 2020-06-16 DIAGNOSIS — F419 Anxiety disorder, unspecified: Secondary | ICD-10-CM | POA: Diagnosis not present

## 2020-06-16 DIAGNOSIS — K219 Gastro-esophageal reflux disease without esophagitis: Secondary | ICD-10-CM | POA: Diagnosis not present

## 2020-06-16 DIAGNOSIS — Z79891 Long term (current) use of opiate analgesic: Secondary | ICD-10-CM | POA: Diagnosis not present

## 2020-06-16 DIAGNOSIS — K5900 Constipation, unspecified: Secondary | ICD-10-CM | POA: Diagnosis not present

## 2020-06-16 DIAGNOSIS — M545 Low back pain, unspecified: Secondary | ICD-10-CM | POA: Diagnosis not present

## 2020-06-23 DIAGNOSIS — H401122 Primary open-angle glaucoma, left eye, moderate stage: Secondary | ICD-10-CM | POA: Diagnosis not present

## 2020-06-23 DIAGNOSIS — H401113 Primary open-angle glaucoma, right eye, severe stage: Secondary | ICD-10-CM | POA: Diagnosis not present

## 2020-07-04 DIAGNOSIS — K219 Gastro-esophageal reflux disease without esophagitis: Secondary | ICD-10-CM | POA: Diagnosis not present

## 2020-07-04 DIAGNOSIS — M9979 Connective tissue and disc stenosis of intervertebral foramina of abdomen and other regions: Secondary | ICD-10-CM | POA: Diagnosis not present

## 2020-07-04 DIAGNOSIS — E1165 Type 2 diabetes mellitus with hyperglycemia: Secondary | ICD-10-CM | POA: Diagnosis not present

## 2020-07-09 DIAGNOSIS — R001 Bradycardia, unspecified: Secondary | ICD-10-CM | POA: Diagnosis not present

## 2020-07-09 DIAGNOSIS — F419 Anxiety disorder, unspecified: Secondary | ICD-10-CM | POA: Diagnosis not present

## 2020-07-09 DIAGNOSIS — E782 Mixed hyperlipidemia: Secondary | ICD-10-CM | POA: Diagnosis not present

## 2020-07-09 DIAGNOSIS — Z125 Encounter for screening for malignant neoplasm of prostate: Secondary | ICD-10-CM | POA: Diagnosis not present

## 2020-07-09 DIAGNOSIS — Z6831 Body mass index (BMI) 31.0-31.9, adult: Secondary | ICD-10-CM | POA: Diagnosis not present

## 2020-07-09 DIAGNOSIS — Z6832 Body mass index (BMI) 32.0-32.9, adult: Secondary | ICD-10-CM | POA: Diagnosis not present

## 2020-07-09 DIAGNOSIS — R42 Dizziness and giddiness: Secondary | ICD-10-CM | POA: Diagnosis not present

## 2020-07-09 DIAGNOSIS — G894 Chronic pain syndrome: Secondary | ICD-10-CM | POA: Diagnosis not present

## 2020-07-09 DIAGNOSIS — Z0000 Encounter for general adult medical examination without abnormal findings: Secondary | ICD-10-CM | POA: Diagnosis not present

## 2020-08-01 DIAGNOSIS — H47393 Other disorders of optic disc, bilateral: Secondary | ICD-10-CM | POA: Diagnosis not present

## 2020-08-01 DIAGNOSIS — Z961 Presence of intraocular lens: Secondary | ICD-10-CM | POA: Diagnosis not present

## 2020-08-01 DIAGNOSIS — H401113 Primary open-angle glaucoma, right eye, severe stage: Secondary | ICD-10-CM | POA: Diagnosis not present

## 2020-08-01 DIAGNOSIS — H401122 Primary open-angle glaucoma, left eye, moderate stage: Secondary | ICD-10-CM | POA: Diagnosis not present

## 2020-08-01 DIAGNOSIS — H04123 Dry eye syndrome of bilateral lacrimal glands: Secondary | ICD-10-CM | POA: Diagnosis not present

## 2020-08-01 DIAGNOSIS — H5213 Myopia, bilateral: Secondary | ICD-10-CM | POA: Diagnosis not present

## 2020-08-02 DIAGNOSIS — M9979 Connective tissue and disc stenosis of intervertebral foramina of abdomen and other regions: Secondary | ICD-10-CM | POA: Diagnosis not present

## 2020-08-02 DIAGNOSIS — E1165 Type 2 diabetes mellitus with hyperglycemia: Secondary | ICD-10-CM | POA: Diagnosis not present

## 2020-08-02 DIAGNOSIS — K219 Gastro-esophageal reflux disease without esophagitis: Secondary | ICD-10-CM | POA: Diagnosis not present

## 2020-08-04 DIAGNOSIS — H401113 Primary open-angle glaucoma, right eye, severe stage: Secondary | ICD-10-CM | POA: Diagnosis not present

## 2020-08-04 DIAGNOSIS — H401122 Primary open-angle glaucoma, left eye, moderate stage: Secondary | ICD-10-CM | POA: Diagnosis not present

## 2020-08-27 DIAGNOSIS — M25511 Pain in right shoulder: Secondary | ICD-10-CM | POA: Diagnosis not present

## 2020-09-08 DIAGNOSIS — G4700 Insomnia, unspecified: Secondary | ICD-10-CM | POA: Diagnosis not present

## 2020-09-08 DIAGNOSIS — Z79891 Long term (current) use of opiate analgesic: Secondary | ICD-10-CM | POA: Diagnosis not present

## 2020-09-08 DIAGNOSIS — M25519 Pain in unspecified shoulder: Secondary | ICD-10-CM | POA: Diagnosis not present

## 2020-09-08 DIAGNOSIS — F419 Anxiety disorder, unspecified: Secondary | ICD-10-CM | POA: Diagnosis not present

## 2020-09-08 DIAGNOSIS — K5900 Constipation, unspecified: Secondary | ICD-10-CM | POA: Diagnosis not present

## 2020-09-08 DIAGNOSIS — M25559 Pain in unspecified hip: Secondary | ICD-10-CM | POA: Diagnosis not present

## 2020-09-08 DIAGNOSIS — K219 Gastro-esophageal reflux disease without esophagitis: Secondary | ICD-10-CM | POA: Diagnosis not present

## 2020-09-08 DIAGNOSIS — M545 Low back pain, unspecified: Secondary | ICD-10-CM | POA: Diagnosis not present

## 2020-09-08 DIAGNOSIS — M5416 Radiculopathy, lumbar region: Secondary | ICD-10-CM | POA: Diagnosis not present

## 2020-10-08 DIAGNOSIS — M25511 Pain in right shoulder: Secondary | ICD-10-CM | POA: Diagnosis not present

## 2020-10-13 DIAGNOSIS — H401113 Primary open-angle glaucoma, right eye, severe stage: Secondary | ICD-10-CM | POA: Diagnosis not present

## 2020-10-13 DIAGNOSIS — H401122 Primary open-angle glaucoma, left eye, moderate stage: Secondary | ICD-10-CM | POA: Diagnosis not present

## 2020-10-31 DIAGNOSIS — M25511 Pain in right shoulder: Secondary | ICD-10-CM | POA: Diagnosis not present

## 2020-11-01 DIAGNOSIS — M9979 Connective tissue and disc stenosis of intervertebral foramina of abdomen and other regions: Secondary | ICD-10-CM | POA: Diagnosis not present

## 2020-11-01 DIAGNOSIS — K219 Gastro-esophageal reflux disease without esophagitis: Secondary | ICD-10-CM | POA: Diagnosis not present

## 2020-11-01 DIAGNOSIS — E1165 Type 2 diabetes mellitus with hyperglycemia: Secondary | ICD-10-CM | POA: Diagnosis not present

## 2020-11-05 DIAGNOSIS — Z9889 Other specified postprocedural states: Secondary | ICD-10-CM | POA: Diagnosis not present

## 2020-11-05 DIAGNOSIS — M7521 Bicipital tendinitis, right shoulder: Secondary | ICD-10-CM | POA: Diagnosis not present

## 2020-11-12 DIAGNOSIS — Z961 Presence of intraocular lens: Secondary | ICD-10-CM | POA: Diagnosis not present

## 2020-11-12 DIAGNOSIS — H401113 Primary open-angle glaucoma, right eye, severe stage: Secondary | ICD-10-CM | POA: Diagnosis not present

## 2020-11-12 DIAGNOSIS — H04123 Dry eye syndrome of bilateral lacrimal glands: Secondary | ICD-10-CM | POA: Diagnosis not present

## 2020-11-12 DIAGNOSIS — H401122 Primary open-angle glaucoma, left eye, moderate stage: Secondary | ICD-10-CM | POA: Diagnosis not present

## 2020-11-27 DIAGNOSIS — H401122 Primary open-angle glaucoma, left eye, moderate stage: Secondary | ICD-10-CM | POA: Diagnosis not present

## 2020-11-27 DIAGNOSIS — H401113 Primary open-angle glaucoma, right eye, severe stage: Secondary | ICD-10-CM | POA: Diagnosis not present

## 2020-12-01 DIAGNOSIS — M9979 Connective tissue and disc stenosis of intervertebral foramina of abdomen and other regions: Secondary | ICD-10-CM | POA: Diagnosis not present

## 2020-12-01 DIAGNOSIS — K219 Gastro-esophageal reflux disease without esophagitis: Secondary | ICD-10-CM | POA: Diagnosis not present

## 2020-12-01 DIAGNOSIS — E1165 Type 2 diabetes mellitus with hyperglycemia: Secondary | ICD-10-CM | POA: Diagnosis not present

## 2020-12-02 DIAGNOSIS — M25519 Pain in unspecified shoulder: Secondary | ICD-10-CM | POA: Diagnosis not present

## 2020-12-02 DIAGNOSIS — M545 Low back pain, unspecified: Secondary | ICD-10-CM | POA: Diagnosis not present

## 2020-12-02 DIAGNOSIS — G4700 Insomnia, unspecified: Secondary | ICD-10-CM | POA: Diagnosis not present

## 2020-12-02 DIAGNOSIS — K5900 Constipation, unspecified: Secondary | ICD-10-CM | POA: Diagnosis not present

## 2020-12-02 DIAGNOSIS — F419 Anxiety disorder, unspecified: Secondary | ICD-10-CM | POA: Diagnosis not present

## 2020-12-02 DIAGNOSIS — Z79891 Long term (current) use of opiate analgesic: Secondary | ICD-10-CM | POA: Diagnosis not present

## 2020-12-02 DIAGNOSIS — K219 Gastro-esophageal reflux disease without esophagitis: Secondary | ICD-10-CM | POA: Diagnosis not present

## 2020-12-02 DIAGNOSIS — M5416 Radiculopathy, lumbar region: Secondary | ICD-10-CM | POA: Diagnosis not present

## 2020-12-02 DIAGNOSIS — M25559 Pain in unspecified hip: Secondary | ICD-10-CM | POA: Diagnosis not present

## 2020-12-12 DIAGNOSIS — Z4889 Encounter for other specified surgical aftercare: Secondary | ICD-10-CM | POA: Diagnosis not present

## 2020-12-12 DIAGNOSIS — M7541 Impingement syndrome of right shoulder: Secondary | ICD-10-CM | POA: Diagnosis not present

## 2020-12-12 DIAGNOSIS — G8918 Other acute postprocedural pain: Secondary | ICD-10-CM | POA: Diagnosis not present

## 2020-12-12 DIAGNOSIS — M7581 Other shoulder lesions, right shoulder: Secondary | ICD-10-CM | POA: Diagnosis not present

## 2020-12-12 DIAGNOSIS — M24611 Ankylosis, right shoulder: Secondary | ICD-10-CM | POA: Diagnosis not present

## 2020-12-12 DIAGNOSIS — I890 Lymphedema, not elsewhere classified: Secondary | ICD-10-CM | POA: Diagnosis not present

## 2020-12-12 DIAGNOSIS — M25511 Pain in right shoulder: Secondary | ICD-10-CM | POA: Diagnosis not present

## 2020-12-12 DIAGNOSIS — M67813 Other specified disorders of tendon, right shoulder: Secondary | ICD-10-CM | POA: Diagnosis not present

## 2020-12-12 DIAGNOSIS — M7521 Bicipital tendinitis, right shoulder: Secondary | ICD-10-CM | POA: Diagnosis not present

## 2020-12-12 DIAGNOSIS — S43431A Superior glenoid labrum lesion of right shoulder, initial encounter: Secondary | ICD-10-CM | POA: Diagnosis not present

## 2020-12-19 DIAGNOSIS — M7521 Bicipital tendinitis, right shoulder: Secondary | ICD-10-CM | POA: Diagnosis not present

## 2020-12-22 DIAGNOSIS — M7521 Bicipital tendinitis, right shoulder: Secondary | ICD-10-CM | POA: Diagnosis not present

## 2020-12-29 DIAGNOSIS — M7521 Bicipital tendinitis, right shoulder: Secondary | ICD-10-CM | POA: Diagnosis not present

## 2021-01-01 DIAGNOSIS — M9979 Connective tissue and disc stenosis of intervertebral foramina of abdomen and other regions: Secondary | ICD-10-CM | POA: Diagnosis not present

## 2021-01-01 DIAGNOSIS — K219 Gastro-esophageal reflux disease without esophagitis: Secondary | ICD-10-CM | POA: Diagnosis not present

## 2021-01-01 DIAGNOSIS — E1165 Type 2 diabetes mellitus with hyperglycemia: Secondary | ICD-10-CM | POA: Diagnosis not present

## 2021-01-06 DIAGNOSIS — M7521 Bicipital tendinitis, right shoulder: Secondary | ICD-10-CM | POA: Diagnosis not present

## 2021-01-08 DIAGNOSIS — H6990 Unspecified Eustachian tube disorder, unspecified ear: Secondary | ICD-10-CM | POA: Diagnosis not present

## 2021-01-08 DIAGNOSIS — R351 Nocturia: Secondary | ICD-10-CM | POA: Diagnosis not present

## 2021-01-08 DIAGNOSIS — G894 Chronic pain syndrome: Secondary | ICD-10-CM | POA: Diagnosis not present

## 2021-01-08 DIAGNOSIS — R6882 Decreased libido: Secondary | ICD-10-CM | POA: Diagnosis not present

## 2021-01-08 DIAGNOSIS — Z6831 Body mass index (BMI) 31.0-31.9, adult: Secondary | ICD-10-CM | POA: Diagnosis not present

## 2021-01-08 DIAGNOSIS — F419 Anxiety disorder, unspecified: Secondary | ICD-10-CM | POA: Diagnosis not present

## 2021-01-08 DIAGNOSIS — Z1331 Encounter for screening for depression: Secondary | ICD-10-CM | POA: Diagnosis not present

## 2021-01-08 DIAGNOSIS — Z1389 Encounter for screening for other disorder: Secondary | ICD-10-CM | POA: Diagnosis not present

## 2021-01-08 DIAGNOSIS — M25562 Pain in left knee: Secondary | ICD-10-CM | POA: Diagnosis not present

## 2021-01-12 DIAGNOSIS — H401122 Primary open-angle glaucoma, left eye, moderate stage: Secondary | ICD-10-CM | POA: Diagnosis not present

## 2021-01-12 DIAGNOSIS — M7521 Bicipital tendinitis, right shoulder: Secondary | ICD-10-CM | POA: Diagnosis not present

## 2021-01-12 DIAGNOSIS — H401113 Primary open-angle glaucoma, right eye, severe stage: Secondary | ICD-10-CM | POA: Diagnosis not present

## 2021-01-19 DIAGNOSIS — H401122 Primary open-angle glaucoma, left eye, moderate stage: Secondary | ICD-10-CM | POA: Diagnosis not present

## 2021-01-19 DIAGNOSIS — H401113 Primary open-angle glaucoma, right eye, severe stage: Secondary | ICD-10-CM | POA: Diagnosis not present

## 2021-02-02 DIAGNOSIS — H401113 Primary open-angle glaucoma, right eye, severe stage: Secondary | ICD-10-CM | POA: Diagnosis not present

## 2021-02-02 DIAGNOSIS — H401122 Primary open-angle glaucoma, left eye, moderate stage: Secondary | ICD-10-CM | POA: Diagnosis not present

## 2021-02-08 ENCOUNTER — Emergency Department (HOSPITAL_COMMUNITY): Payer: Medicare HMO

## 2021-02-08 ENCOUNTER — Other Ambulatory Visit: Payer: Self-pay

## 2021-02-08 ENCOUNTER — Emergency Department (HOSPITAL_COMMUNITY)
Admission: EM | Admit: 2021-02-08 | Discharge: 2021-02-08 | Disposition: A | Discharge status: Home / Self Care | Payer: Medicare HMO | Attending: Emergency Medicine | Admitting: Emergency Medicine

## 2021-02-08 ENCOUNTER — Encounter (HOSPITAL_COMMUNITY): Payer: Self-pay | Admitting: Emergency Medicine

## 2021-02-08 DIAGNOSIS — Z87891 Personal history of nicotine dependence: Secondary | ICD-10-CM | POA: Diagnosis not present

## 2021-02-08 DIAGNOSIS — M25562 Pain in left knee: Secondary | ICD-10-CM | POA: Insufficient documentation

## 2021-02-08 DIAGNOSIS — M25462 Effusion, left knee: Secondary | ICD-10-CM | POA: Diagnosis not present

## 2021-02-08 MED ORDER — IBUPROFEN 600 MG PO TABS: 600.0000 mg | ORAL_TABLET | Freq: Two times a day (BID) | ORAL | 0 refills | Status: AC

## 2021-02-08 NOTE — ED Triage Notes (Signed)
Pt fell and injured left knee one week ago. Reports it has been "locked up" for 3 days. Percocet taken for pain this morning. Tried ice with no relief.

## 2021-02-08 NOTE — ED Provider Notes (Signed)
[facility] EMERGENCY DEPARTMENT Provider Note   CSN: [place]:[id number] Arrival date & time: 02/08/21  1456     History No chief complaint on file.   Steven Richard is a 65 y.o. male.  HPI  Patient with significant medical history of arthritis, chronic back pain presents to the emergency department with chief complaint of left knee pain.  Patient states 1 week ago he was out weed eating and he bumped the lateral aspect of his knee on a structural part of a road, states since then he has been having some left knee pain, states that the pain is mainly on the lateral aspect and will radiate up and down his leg, he has occasional paresthesias in his lower feet, states he has pain when he touches the area, increased pain with movement and weightbearing, states he is able to walk but hurts when he does so.  He states this morning it locked up and he was unable to bend it due to severe pain.  He states he is never had anything like this in the past.  He states he been taking Percocet that he got for his right shoulder and this does not seem to help, is not taking any other medications for this.  He does not endorse fevers, chills, chest pain, shortness of breath, worsening pedal edema.  Past Medical History:  Diagnosis Date   Arthritis    Chronic back pain    GERD (gastroesophageal reflux disease)    Glaucoma    History of kidney stones    history of    There are no problems to display for this patient.   Past Surgical History:  Procedure Laterality Date   CATARACT EXTRACTION W/PHACO Right 06/01/2016   Procedure: CATARACT EXTRACTION PHACO AND INTRAOCULAR LENS PLACEMENT (IOC);  Surgeon: Rutherford Guys, MD;  Location: [facility];  Service: Ophthalmology;  Laterality: Right;  CDE:  4.34   CATARACT EXTRACTION W/PHACO Left 06/15/2016   Procedure: CATARACT EXTRACTION PHACO AND INTRAOCULAR LENS PLACEMENT (IOC);  Surgeon: Rutherford Guys, MD;  Location: [facility];  Service: Ophthalmology;  Laterality: Left;  CDE:  4.37   EYE SURGERY     ORCHIECTOMY Left    ruptured testicle   SHOULDER ARTHROSCOPY WITH ROTATOR CUFF REPAIR Right 02/14/2020   Procedure: Right Shoulder arthroscopy, subacromial decompression, distal clavicle resection rotator cuff repair;  Surgeon: Justice Britain, MD;  Location: [facility];  Service: Orthopedics;  Laterality: Right;   TYMPANOPLASTY Right    with pathching       Family History  Problem Relation Age of Onset   Diabetes Mother    Dementia Mother    Diabetes Father     Social History   Tobacco Use   Smoking status: Former    Packs/day: 1.00    Years: 5.00    Pack years: 5.00    Types: Cigarettes    Quit date: 05/26/1968    Years since quitting: 52.7   Smokeless tobacco: Never  Vaping Use   Vaping Use: Never used  Substance Use Topics   Alcohol use: No   Drug use: No    Home Medications Prior to Admission medications   Medication Sig Start Date End Date Taking? Authorizing Provider  ibuprofen (ADVIL) 600 MG tablet Take 1 tablet (600 mg total) by mouth in the morning and at bedtime for 7 days. 02/08/21 02/15/21 Yes Marcello Fennel, PA-C  ALPRAZolam Duanne Moron) 1 MG tablet Take 1 mg by mouth at bedtime.     [provider]  cyclobenzaprine (FLEXERIL) 10 MG tablet Take 1 tablet (10 mg total) by mouth 3 (three) times daily as needed for muscle spasms. 02/14/20   Shuford, Olivia Mackie, PA-C  cycloSPORINE (RESTASIS) 0.05 % ophthalmic emulsion Place 1 drop into both eyes 2 (two) times daily.    [provider]  DEXILANT 60 MG capsule Take 60 mg by mouth daily before breakfast.  06/05/19   [provider]  dorzolamide (TRUSOPT) 2 % ophthalmic solution Place 1 drop into both eyes 2 (two) times daily.    [provider]  HYDROmorphone (DILAUDID) 2 MG tablet Take 1-2 tablets (2-4 mg total) by mouth every 4 (four) hours as needed (prn post op pain not covered by your chronic oxycodone). 02/14/20   Shuford, Olivia Mackie, PA-C  LINZESS 72 MCG capsule Take 72  mcg by mouth at bedtime.  05/26/19   [provider]  meloxicam (MOBIC) 15 MG tablet Take 1 tablet (15 mg total) by mouth daily. 02/14/20   Shuford, Olivia Mackie, PA-C  ondansetron (ZOFRAN) 4 MG tablet Take 1 tablet (4 mg total) by mouth every 8 (eight) hours as needed for nausea or vomiting. 02/14/20   Shuford, Olivia Mackie, PA-C  oxyCODONE-acetaminophen (PERCOCET) 10-325 MG tablet Take 1 tablet by mouth 2 (two) times daily. 04/24/16   [provider]  PARoxetine (PAXIL) 20 MG tablet Take 20 mg by mouth at bedtime.  04/30/16   [provider]  pregabalin (LYRICA) 150 MG capsule Take 150 mg by mouth in the morning, at noon, and at bedtime.  07/07/19   [provider]  Travoprost, BAK Free, (TRAVATAN) 0.004 % SOLN ophthalmic solution Place 1 drop into both eyes at bedtime.    [provider]  traZODone (DESYREL) 100 MG tablet Take 100 mg by mouth at bedtime.  04/30/16   [provider]    Allergies    Patient has no known allergies.  Review of Systems   Review of Systems  Constitutional:  Negative for chills and fever.  HENT:  Negative for congestion.   Respiratory:  Negative for shortness of breath.   Cardiovascular:  Negative for chest pain.  Gastrointestinal:  Negative for abdominal pain.  Genitourinary:  Negative for enuresis.  Musculoskeletal:  Negative for back pain.       Left knee pain  Skin:  Negative for rash.  Neurological:  Positive for numbness. Negative for dizziness.  Hematological:  Does not bruise/bleed easily.   Physical Exam Updated Vital Signs BP 139/67   Pulse (!) 54   Temp 98.2 F (36.8 C) (Oral)   Resp 18   Ht '5\' 10"'$  (1.778 m)   Wt 98.9 kg   SpO2 96%   BMI 31.28 kg/m   Physical Exam Vitals and nursing note reviewed.  Constitutional:      General: He is not in acute distress.    Appearance: Normal appearance. He is not ill-appearing or diaphoretic.  HENT:     Head: Normocephalic and atraumatic.     Nose: No  congestion or rhinorrhea.  Eyes:     General: No scleral icterus.       Right eye: No discharge.        Left eye: No discharge.     Conjunctiva/sclera: Conjunctivae normal.  Cardiovascular:     Rate and Rhythm: Normal rate and regular rhythm.  Pulmonary:     Effort: Pulmonary effort is normal.  Musculoskeletal:     Cervical back: Neck supple.     Right lower leg: No  edema.     Left lower leg: No edema.     Comments: Lower extremities were visualized there is no gross deformities present, no unilateral swelling noted, left knee has no gross deformities present, he has full range of motion in his toes ankle, knee has limited range of motion with flexion and extension due to severe pain, able to passively range knee without difficulty, no joint laxity present.  Skin:    General: Skin is warm and dry.     Coloration: Skin is not jaundiced or pale.  Neurological:     Mental Status: He is alert and oriented to person, place, and time.  Psychiatric:        Mood and Affect: Mood normal.    ED Results / Procedures / Treatments   Labs (all labs ordered are listed, but only abnormal results are displayed) Labs Reviewed - No data to display  EKG None  Radiology DG Knee Complete 4 Views Left  Result Date: 02/08/2021 CLINICAL DATA:  Fall, left knee pain EXAM: LEFT KNEE - COMPLETE 4+ VIEW COMPARISON:  None. FINDINGS: Normal alignment. No fracture or dislocation. Minimal medial compartment degenerative arthritis with osteophyte formation. Small left knee effusion is present. Soft tissues are otherwise unremarkable. IMPRESSION: Small left knee effusion. Electronically Signed   By: Fidela Salisbury MD   On: 02/08/2021 15:51    Procedures Procedures   Medications Ordered in ED Medications - No data to display  ED Course  I have reviewed the triage vital signs and the nursing notes.  Pertinent labs & imaging results that were available during my care of the patient were reviewed by me and  considered in my medical decision making (see chart for details).    MDM Rules/Calculators/A&P                          Initial impression-patient presents with left knee pain.  He is alert, does not appear in distress, vital signs reassuring.  Triage obtain imaging.  Work-up-DG shows small Effusion no acute abnormalities.  Rule out- I have low suspicion for septic arthritis as patient denies IV drug use, skin exam was performed no erythematous, edematous, warm joints noted on exam  Low suspicion for fracture or dislocation as x-ray does not feel any significant findings. low suspicion for ligament or tendon damage as area was palpated no gross defects noted, they had full range of motion as well as 5/5 strength.  Low suspicion for compartment syndrome as area was palpated it was soft to the touch, neurovascular fully intact.   Plan-  Left knee pain-Suspect patient has a muscular strain of the left knee, there is a very small probability of a possible partial ligament tear will place him in a splint, start him on ibuprofen, lab work was reviewed last CMP was in 2019, he was recently on Mobic  2021, he states he has no chronic kidney disease. Will have him continue with Percocet and follow-up with orthopedic surgeon for further evaluation.  Vital signs have remained stable, no indication for hospital admission.   Patient given at home care as well strict return precautions.  Patient verbalized that they understood agreed to said plan.  Final Clinical Impression(s) / ED Diagnoses Final diagnoses:  Acute pain of left knee    Rx / DC Orders ED Discharge Orders          Ordered    ibuprofen (ADVIL) 600 MG tablet  2 times daily  02/08/21 [place], Heitor Steinhoff J, PA-C 02/08/21 1648    Milton Ferguson, MD 02/15/21 1029

## 2021-02-08 NOTE — Discharge Instructions (Addendum)
Your imaging looks reassuring.  I suspect you have a muscular strain it is possible he might have a slight ligament damage, I have placed you in an splint please wear during the day you may take off at nighttime, I have started you on ibuprofen please take as prescribed.  I would like you to take this in conjunction with your narcotic medication.  I recommend applying ice to the area and keeping it elevated while not use.  Please follow-up with your orthopedic doctor for further evaluation.  Come back to the emergency department if you develop chest pain, shortness of breath, severe abdominal pain, uncontrolled nausea, vomiting, diarrhea.

## 2021-02-24 DIAGNOSIS — Z79891 Long term (current) use of opiate analgesic: Secondary | ICD-10-CM | POA: Diagnosis not present

## 2021-02-24 DIAGNOSIS — M25559 Pain in unspecified hip: Secondary | ICD-10-CM | POA: Diagnosis not present

## 2021-02-24 DIAGNOSIS — M5416 Radiculopathy, lumbar region: Secondary | ICD-10-CM | POA: Diagnosis not present

## 2021-02-24 DIAGNOSIS — M545 Low back pain, unspecified: Secondary | ICD-10-CM | POA: Diagnosis not present

## 2021-02-24 DIAGNOSIS — K5900 Constipation, unspecified: Secondary | ICD-10-CM | POA: Diagnosis not present

## 2021-02-24 DIAGNOSIS — M25519 Pain in unspecified shoulder: Secondary | ICD-10-CM | POA: Diagnosis not present

## 2021-02-24 DIAGNOSIS — G4700 Insomnia, unspecified: Secondary | ICD-10-CM | POA: Diagnosis not present

## 2021-02-24 DIAGNOSIS — K219 Gastro-esophageal reflux disease without esophagitis: Secondary | ICD-10-CM | POA: Diagnosis not present

## 2021-02-24 DIAGNOSIS — F419 Anxiety disorder, unspecified: Secondary | ICD-10-CM | POA: Diagnosis not present

## 2021-03-04 DIAGNOSIS — E1165 Type 2 diabetes mellitus with hyperglycemia: Secondary | ICD-10-CM | POA: Diagnosis not present

## 2021-03-04 DIAGNOSIS — M9979 Connective tissue and disc stenosis of intervertebral foramina of abdomen and other regions: Secondary | ICD-10-CM | POA: Diagnosis not present

## 2021-03-04 DIAGNOSIS — K219 Gastro-esophageal reflux disease without esophagitis: Secondary | ICD-10-CM | POA: Diagnosis not present

## 2021-03-18 ENCOUNTER — Ambulatory Visit: Payer: Medicare HMO | Admitting: Urology

## 2021-03-24 DIAGNOSIS — H401122 Primary open-angle glaucoma, left eye, moderate stage: Secondary | ICD-10-CM | POA: Diagnosis not present

## 2021-03-24 DIAGNOSIS — H401113 Primary open-angle glaucoma, right eye, severe stage: Secondary | ICD-10-CM | POA: Diagnosis not present

## 2021-03-25 ENCOUNTER — Ambulatory Visit: Payer: Medicare HMO | Admitting: Urology

## 2021-03-25 ENCOUNTER — Encounter: Payer: Self-pay | Admitting: Urology

## 2021-03-25 ENCOUNTER — Other Ambulatory Visit: Payer: Self-pay

## 2021-03-25 VITALS — BP 149/95 | HR 65 | Ht 70.0 in | Wt 216.5 lb

## 2021-03-25 DIAGNOSIS — N5201 Erectile dysfunction due to arterial insufficiency: Secondary | ICD-10-CM | POA: Diagnosis not present

## 2021-03-25 DIAGNOSIS — R972 Elevated prostate specific antigen [PSA]: Secondary | ICD-10-CM

## 2021-03-25 LAB — MICROSCOPIC EXAMINATION
Epithelial Cells (non renal): NONE SEEN /hpf (ref 0–10)
RBC, Urine: NONE SEEN /hpf (ref 0–2)
WBC, UA: NONE SEEN /hpf (ref 0–5)

## 2021-03-25 LAB — URINALYSIS, ROUTINE W REFLEX MICROSCOPIC
Bilirubin, UA: NEGATIVE
Glucose, UA: NEGATIVE
Ketones, UA: NEGATIVE
Leukocytes,UA: NEGATIVE
Nitrite, UA: NEGATIVE
RBC, UA: NEGATIVE
Specific Gravity, UA: >1.03 — ABNORMAL HIGH (ref 1.005–1.030)
Urobilinogen, Ur: 0.2 mg/dL (ref 0.2–1.0)
pH, UA: 5 (ref 5.0–7.5)

## 2021-03-25 LAB — BLADDER SCAN AMB NON-IMAGING: Scan Result: 12

## 2021-03-25 MED ORDER — SILDENAFIL CITRATE 100 MG PO TABS: 100.0000 mg | ORAL_TABLET | Freq: Every day | ORAL | 6 refills | Status: AC | PRN

## 2021-03-25 NOTE — Patient Instructions (Signed)
Prostate-Specific Antigen Test Why am I having this test? The prostate-specific antigen (PSA) test is a screening test for prostate cancer. It can identify early signs of prostate cancer, which may allow for more effective treatment. Your health care provider may recommend that you have a PSA test starting at age 67 or that you have one earlier or later, depending on your risk factors for prostate cancer. You may also have a PSA test: To monitor treatment of prostate cancer. To check whether prostate cancer has returned after treatment. If you have signs of other conditions that can affect PSA levels, such as: An enlarged prostate that is not caused by cancer (benign prostatic hyperplasia, BPH). This condition is very common in older men. A prostate infection. What is being tested? This test measures the amount of PSA in your blood. PSA is a protein that is made in the prostate. The prostate naturally produces more PSA as you age, but very high levels may be a sign of a medical condition. What kind of sample is taken? A blood sample is required for this test. It is usually collected by inserting a needle into a blood vessel or by sticking a finger with a small needle. Blood for this test should be drawn before having an exam of the prostate. How do I prepare for this test? Do not ejaculate starting 24 hours before your test, or as long as told by your health care provider. Tell a health care provider about: Any allergies you have. All medicines you are taking, including vitamins, herbs, eye drops, creams, and over-the-counter medicines. This also includes: Medicines to assist with hair growth, such as finasteride. Any recent exposure to a medicine called diethylstilbestrol. Any blood disorders you have. Any recent procedures you have had, especially any procedures involving the prostate or rectum. Any medical conditions you have. Any recent urinary tract infections (UTIs) you have had. How are  the results reported? Your test results will be reported as a value that indicates how much PSA is in your blood. This will be given as nanograms of PSA per milliliter of blood (ng/mL). Your health care provider will compare your results to normal ranges that were established after testing a large group of people (reference ranges). Reference ranges may vary among labs and hospitals. PSA levels vary from person to person and generally increase with age. Because of this variation, there is no single PSA value that is considered normal for everyone. Instead, PSA reference ranges are used to describe whether your PSA levels are considered low or high (elevated). Common reference ranges are: Low: 0-2.5 ng/mL. Slightly to moderately elevated: 2.6-10.0 ng/mL. Moderately elevated: 10.0-19.9 ng/mL. Significantly elevated: 20 ng/mL or greater. Sometimes, the test results may report that a condition is present when it is not present (false-positive result). What do the results mean? A test result that is higher than 4 ng/mL may mean that you are at an increased risk for prostate cancer. However, a PSA test by itself is not enough to diagnose prostate cancer. High PSA levels may also be caused by the natural aging process, prostate infection, or BPH. PSA screening cannot tell you if your PSA is high due to cancer or a different cause. A prostate biopsy is the only way to diagnose prostate cancer. A risk of having the PSA test is diagnosing and treating prostate cancer that would never have caused any symptoms or problems (overdiagnosis and overtreatment). Talk with your health care provider about what your results mean. Questions  to ask your health care provider Ask your health care provider, or the department that is doing the test: When will my results be ready? How will I get my results? What are my treatment options? What other tests do I need? What are my next steps? Summary The prostate-specific  antigen (PSA) test is a screening test for prostate cancer. Your health care provider may recommend that you have a PSA test starting at age 40 or that you have one earlier or later, depending on your risk factors for prostate cancer. A test result that is higher than 4 ng/mL may mean that you are at an increased risk for prostate cancer. However, elevated levels can be caused by a number of conditions other than prostate cancer. Talk with your health care provider about what your results mean. This information is not intended to replace advice given to you by your health care provider. Make sure you discuss any questions you have with your health care provider. Document Revised: 03/06/2020 Document Reviewed: 03/06/2020 Elsevier Patient Education  2022 [employer].

## 2021-03-25 NOTE — Progress Notes (Signed)
Urological Symptom Review  Patient is experiencing the following symptoms: Get up at night to urinate   Review of Systems  Gastrointestinal (upper)  : Negative for upper GI symptoms  Gastrointestinal (lower) : Negative for lower GI symptoms  Constitutional : Negative for symptoms  Skin: Negative for skin symptoms  Eyes: Blurred vision  Ear/Nose/Throat : Negative for Ear/Nose/Throat symptoms  Hematologic/Lymphatic: Easy bruising  Cardiovascular : Negative for cardiovascular symptoms  Respiratory : Negative for respiratory symptoms  Endocrine: Negative for endocrine symptoms  Musculoskeletal: Back pain Joint pain  Neurological: Negative for neurological symptoms  Psychologic: Anxiety

## 2021-03-25 NOTE — Progress Notes (Signed)
post void residual=12 

## 2021-03-25 NOTE — Progress Notes (Signed)
03/25/2021 9:25 AM   Steven Richard 14-Jan-1956 [id number]  Referring provider: Curlene Labrum, MD [place],  [place] [address]  Elevated PSA   HPI: [name] is a 66yo here for evaluation of elevated PSA. PSA was 4.2 this year and the patient does not recall a prior PSA value. IPSS 10 QOL 3. Nocturia 2x. His biggest complaint is urinary frequency which is due to his high water consumption. No family hx of prostate cancer. He has a hx of nephrolithiasis with his last events for 2-3 years. He has issues getting an erection for the past 4 years. Good libido. Good exercise tolerance    PMH: Past Medical History:  Diagnosis Date   Arthritis    Chronic back pain    GERD (gastroesophageal reflux disease)    Glaucoma    History of kidney stones    history of    Surgical History: Past Surgical History:  Procedure Laterality Date   CATARACT EXTRACTION W/PHACO Right 06/01/2016   Procedure: CATARACT EXTRACTION PHACO AND INTRAOCULAR LENS PLACEMENT ([place]);  Surgeon: Steven Guys, MD;  Location: [facility];  Service: Ophthalmology;  Laterality: Right;  CDE:  4.34   CATARACT EXTRACTION W/PHACO Left 06/15/2016   Procedure: CATARACT EXTRACTION PHACO AND INTRAOCULAR LENS PLACEMENT (IOC);  Surgeon: Steven Guys, MD;  Location: [facility];  Service: Ophthalmology;  Laterality: Left;  CDE: 4.37   EYE SURGERY     ORCHIECTOMY Left    ruptured testicle   SHOULDER ARTHROSCOPY WITH ROTATOR CUFF REPAIR Right 02/14/2020   Procedure: Right Shoulder arthroscopy, subacromial decompression, distal clavicle resection rotator cuff repair;  Surgeon: Steven Britain, MD;  Location: [facility];  Service: Orthopedics;  Laterality: Right;   TYMPANOPLASTY Right    with pathching    Home Medications:  Allergies as of 03/25/2021   No Known Allergies      Medication List        Accurate as of March 25, 2021  9:25 AM. If you have any questions, ask your nurse or doctor.          ALPRAZolam 1 MG  tablet Commonly known as: XANAX Take 1 mg by mouth at bedtime.   cyclobenzaprine 10 MG tablet Commonly known as: FLEXERIL Take 1 tablet (10 mg total) by mouth 3 (three) times daily as needed for muscle spasms.   cycloSPORINE 0.05 % ophthalmic emulsion Commonly known as: RESTASIS Place 1 drop into both eyes 2 (two) times daily.   Dexilant 60 MG capsule Generic drug: dexlansoprazole Take 60 mg by mouth daily before breakfast.   dorzolamide 2 % ophthalmic solution Commonly known as: TRUSOPT Place 1 drop into both eyes 2 (two) times daily.   HYDROmorphone 2 MG tablet Commonly known as: Dilaudid Take 1-2 tablets (2-4 mg total) by mouth every 4 (four) hours as needed (prn post op pain not covered by your chronic oxycodone).   Linzess 72 MCG capsule Generic drug: linaclotide Take 72 mcg by mouth at bedtime.   meloxicam 15 MG tablet Commonly known as: MOBIC Take 1 tablet (15 mg total) by mouth daily.   ondansetron 4 MG tablet Commonly known as: Zofran Take 1 tablet (4 mg total) by mouth every 8 (eight) hours as needed for nausea or vomiting.   oxyCODONE-acetaminophen 10-325 MG tablet Commonly known as: PERCOCET Take 1 tablet by mouth 2 (two) times daily.   PARoxetine 20 MG tablet Commonly known as: PAXIL Take 20 mg by mouth at bedtime.   pregabalin 150 MG  capsule Commonly known as: LYRICA Take 150 mg by mouth in the morning, at noon, and at bedtime.   Travoprost (BAK Free) 0.004 % Soln ophthalmic solution Commonly known as: TRAVATAN Place 1 drop into both eyes at bedtime.   traZODone 100 MG tablet Commonly known as: DESYREL Take 100 mg by mouth at bedtime.        Allergies: No Known Allergies  Family History: Family History  Problem Relation Age of Onset   Diabetes Mother    Dementia Mother    Diabetes Father     Social History:  reports that he quit smoking about 52 years ago. His smoking use included cigarettes. He has a 5.00 pack-year smoking history.  He has never used smokeless tobacco. He reports that he does not drink alcohol and does not use drugs.  ROS: All other review of systems were reviewed and are negative except what is noted above in HPI  Physical Exam: BP (!) 149/95   Pulse 65   Ht 5\' 10"  (1.778 m)   Wt 216 lb 8 oz (98.2 kg)   BMI 31.06 kg/m   Constitutional:  Alert and oriented, No acute distress. HEENT:  AT, moist mucus membranes.  Trachea midline, no masses. Cardiovascular: No clubbing, cyanosis, or edema. Respiratory: Normal respiratory effort, no increased work of breathing. GI: Abdomen is soft, nontender, nondistended, no abdominal masses GU: No CVA tenderness. Circumcised phallus. No masses/lesions on penis, testis, scrotum. Absent left testis Prostate 40g smooth no nodules no induration.  Lymph: No cervical or inguinal lymphadenopathy. Skin: No rashes, bruises or suspicious lesions. Neurologic: Grossly intact, no focal deficits, moving all 4 extremities. Psychiatric: Normal mood and affect.  Laboratory Data: Lab Results  Component Value Date   WBC 7.6 02/11/2020   HGB 14.8 02/11/2020   HCT 46.4 02/11/2020   MCV 89.6 02/11/2020   PLT 233 02/11/2020    Lab Results  Component Value Date   CREATININE 1.19 05/26/2016    No results found for: PSA  No results found for: TESTOSTERONE  No results found for: HGBA1C  Urinalysis No results found for: COLORURINE, APPEARANCEUR, LABSPEC, PHURINE, GLUCOSEU, HGBUR, BILIRUBINUR, KETONESUR, PROTEINUR, UROBILINOGEN, NITRITE, LEUKOCYTESUR  No results found for: LABMICR, [place], RBCUA, LABEPIT, MUCUS, BACTERIA  Pertinent Imaging:  No results found for this or any previous visit.  No results found for this or any previous visit.  No results found for this or any previous visit.  No results found for this or any previous visit.  No results found for this or any previous visit.  No results found for this or any previous visit.  No results found for this  or any previous visit.  No results found for this or any previous visit.   Assessment & Plan:    1. Elevated PSA -The patient and I talked about etiologies of elevated PSA.  We discussed the possible relationship between elevated PSA and prostate cancer, BPH, prostatitis, infection trauma and recent ejaculations.   I recommended that we follow-up with a repeat PSA in 6 months.   - Urinalysis, Routine w reflex microscopic - BLADDER SCAN AMB NON-IMAGING  2. Erectile dysfunction -Sildenafil 100mg  prn   No follow-ups on file.  Nicolette Bang, MD  [facility] [place]

## 2021-03-31 DIAGNOSIS — K219 Gastro-esophageal reflux disease without esophagitis: Secondary | ICD-10-CM | POA: Diagnosis not present

## 2021-03-31 DIAGNOSIS — H401113 Primary open-angle glaucoma, right eye, severe stage: Secondary | ICD-10-CM | POA: Diagnosis not present

## 2021-04-07 DIAGNOSIS — M62838 Other muscle spasm: Secondary | ICD-10-CM | POA: Diagnosis not present

## 2021-04-07 DIAGNOSIS — Z6832 Body mass index (BMI) 32.0-32.9, adult: Secondary | ICD-10-CM | POA: Diagnosis not present

## 2021-04-07 DIAGNOSIS — S39012A Strain of muscle, fascia and tendon of lower back, initial encounter: Secondary | ICD-10-CM | POA: Diagnosis not present

## 2021-04-07 DIAGNOSIS — M533 Sacrococcygeal disorders, not elsewhere classified: Secondary | ICD-10-CM | POA: Diagnosis not present

## 2021-05-12 ENCOUNTER — Other Ambulatory Visit (HOSPITAL_COMMUNITY): Payer: Self-pay | Admitting: Neurology

## 2021-05-12 ENCOUNTER — Other Ambulatory Visit: Payer: Self-pay

## 2021-05-12 ENCOUNTER — Ambulatory Visit (HOSPITAL_COMMUNITY)
Admission: RE | Admit: 2021-05-12 | Discharge: 2021-05-12 | Disposition: A | Discharge status: Home / Self Care | Payer: Medicare HMO | Source: Ambulatory Visit | Attending: Neurology | Admitting: Neurology

## 2021-05-12 DIAGNOSIS — K219 Gastro-esophageal reflux disease without esophagitis: Secondary | ICD-10-CM | POA: Diagnosis not present

## 2021-05-12 DIAGNOSIS — M1712 Unilateral primary osteoarthritis, left knee: Secondary | ICD-10-CM | POA: Diagnosis not present

## 2021-05-12 DIAGNOSIS — M25561 Pain in right knee: Secondary | ICD-10-CM | POA: Insufficient documentation

## 2021-05-12 DIAGNOSIS — M545 Low back pain, unspecified: Secondary | ICD-10-CM | POA: Insufficient documentation

## 2021-05-12 DIAGNOSIS — M25562 Pain in left knee: Secondary | ICD-10-CM

## 2021-05-12 DIAGNOSIS — M5416 Radiculopathy, lumbar region: Secondary | ICD-10-CM | POA: Diagnosis not present

## 2021-05-12 DIAGNOSIS — M25559 Pain in unspecified hip: Secondary | ICD-10-CM | POA: Diagnosis not present

## 2021-05-12 DIAGNOSIS — M25519 Pain in unspecified shoulder: Secondary | ICD-10-CM | POA: Diagnosis not present

## 2021-05-12 DIAGNOSIS — Z79891 Long term (current) use of opiate analgesic: Secondary | ICD-10-CM | POA: Diagnosis not present

## 2021-05-12 DIAGNOSIS — F419 Anxiety disorder, unspecified: Secondary | ICD-10-CM | POA: Diagnosis not present

## 2021-05-12 DIAGNOSIS — G4700 Insomnia, unspecified: Secondary | ICD-10-CM | POA: Diagnosis not present

## 2021-05-12 DIAGNOSIS — K5900 Constipation, unspecified: Secondary | ICD-10-CM | POA: Diagnosis not present

## 2021-05-25 DIAGNOSIS — M25562 Pain in left knee: Secondary | ICD-10-CM | POA: Diagnosis not present

## 2021-05-25 DIAGNOSIS — M25561 Pain in right knee: Secondary | ICD-10-CM | POA: Diagnosis not present

## 2021-05-25 DIAGNOSIS — M545 Low back pain, unspecified: Secondary | ICD-10-CM | POA: Diagnosis not present

## 2021-06-11 DIAGNOSIS — R202 Paresthesia of skin: Secondary | ICD-10-CM | POA: Diagnosis not present

## 2021-06-11 DIAGNOSIS — G609 Hereditary and idiopathic neuropathy, unspecified: Secondary | ICD-10-CM | POA: Diagnosis not present

## 2021-06-23 DIAGNOSIS — H6121 Impacted cerumen, right ear: Secondary | ICD-10-CM | POA: Diagnosis not present

## 2021-06-23 DIAGNOSIS — B351 Tinea unguium: Secondary | ICD-10-CM | POA: Diagnosis not present

## 2021-06-23 DIAGNOSIS — R42 Dizziness and giddiness: Secondary | ICD-10-CM | POA: Diagnosis not present

## 2021-06-23 DIAGNOSIS — Z6832 Body mass index (BMI) 32.0-32.9, adult: Secondary | ICD-10-CM | POA: Diagnosis not present

## 2021-06-30 DIAGNOSIS — Z79891 Long term (current) use of opiate analgesic: Secondary | ICD-10-CM | POA: Diagnosis not present

## 2021-06-30 DIAGNOSIS — M25519 Pain in unspecified shoulder: Secondary | ICD-10-CM | POA: Diagnosis not present

## 2021-06-30 DIAGNOSIS — K5900 Constipation, unspecified: Secondary | ICD-10-CM | POA: Diagnosis not present

## 2021-06-30 DIAGNOSIS — F419 Anxiety disorder, unspecified: Secondary | ICD-10-CM | POA: Diagnosis not present

## 2021-06-30 DIAGNOSIS — M25559 Pain in unspecified hip: Secondary | ICD-10-CM | POA: Diagnosis not present

## 2021-06-30 DIAGNOSIS — M5416 Radiculopathy, lumbar region: Secondary | ICD-10-CM | POA: Diagnosis not present

## 2021-06-30 DIAGNOSIS — M545 Low back pain, unspecified: Secondary | ICD-10-CM | POA: Diagnosis not present

## 2021-06-30 DIAGNOSIS — G4700 Insomnia, unspecified: Secondary | ICD-10-CM | POA: Diagnosis not present

## 2021-06-30 DIAGNOSIS — K219 Gastro-esophageal reflux disease without esophagitis: Secondary | ICD-10-CM | POA: Diagnosis not present

## 2021-07-10 DIAGNOSIS — R42 Dizziness and giddiness: Secondary | ICD-10-CM | POA: Diagnosis not present

## 2021-07-10 DIAGNOSIS — Z0000 Encounter for general adult medical examination without abnormal findings: Secondary | ICD-10-CM | POA: Diagnosis not present

## 2021-07-10 DIAGNOSIS — Z6832 Body mass index (BMI) 32.0-32.9, adult: Secondary | ICD-10-CM | POA: Diagnosis not present

## 2021-07-10 DIAGNOSIS — R978 Other abnormal tumor markers: Secondary | ICD-10-CM | POA: Diagnosis not present

## 2021-07-10 DIAGNOSIS — G894 Chronic pain syndrome: Secondary | ICD-10-CM | POA: Diagnosis not present

## 2021-07-10 DIAGNOSIS — M9979 Connective tissue and disc stenosis of intervertebral foramina of abdomen and other regions: Secondary | ICD-10-CM | POA: Diagnosis not present

## 2021-07-14 DIAGNOSIS — R202 Paresthesia of skin: Secondary | ICD-10-CM | POA: Diagnosis not present

## 2021-07-14 DIAGNOSIS — M62838 Other muscle spasm: Secondary | ICD-10-CM | POA: Diagnosis not present

## 2021-07-14 DIAGNOSIS — M25562 Pain in left knee: Secondary | ICD-10-CM | POA: Diagnosis not present

## 2021-07-14 DIAGNOSIS — R531 Weakness: Secondary | ICD-10-CM | POA: Diagnosis not present

## 2021-07-14 DIAGNOSIS — R200 Anesthesia of skin: Secondary | ICD-10-CM | POA: Diagnosis not present

## 2021-07-14 DIAGNOSIS — M25569 Pain in unspecified knee: Secondary | ICD-10-CM | POA: Diagnosis not present

## 2021-07-14 DIAGNOSIS — M25561 Pain in right knee: Secondary | ICD-10-CM | POA: Diagnosis not present

## 2021-07-14 DIAGNOSIS — R253 Fasciculation: Secondary | ICD-10-CM | POA: Diagnosis not present

## 2021-08-05 DIAGNOSIS — Z1211 Encounter for screening for malignant neoplasm of colon: Secondary | ICD-10-CM | POA: Diagnosis not present

## 2021-08-22 DIAGNOSIS — M5116 Intervertebral disc disorders with radiculopathy, lumbar region: Secondary | ICD-10-CM | POA: Diagnosis not present

## 2021-08-22 DIAGNOSIS — M4726 Other spondylosis with radiculopathy, lumbar region: Secondary | ICD-10-CM | POA: Diagnosis not present

## 2021-08-24 DIAGNOSIS — H401122 Primary open-angle glaucoma, left eye, moderate stage: Secondary | ICD-10-CM | POA: Diagnosis not present

## 2021-08-24 DIAGNOSIS — H401113 Primary open-angle glaucoma, right eye, severe stage: Secondary | ICD-10-CM | POA: Diagnosis not present

## 2021-09-08 DIAGNOSIS — M5416 Radiculopathy, lumbar region: Secondary | ICD-10-CM | POA: Diagnosis not present

## 2021-09-08 DIAGNOSIS — H401122 Primary open-angle glaucoma, left eye, moderate stage: Secondary | ICD-10-CM | POA: Diagnosis not present

## 2021-09-15 DIAGNOSIS — H401122 Primary open-angle glaucoma, left eye, moderate stage: Secondary | ICD-10-CM | POA: Diagnosis not present

## 2021-09-16 DIAGNOSIS — H401122 Primary open-angle glaucoma, left eye, moderate stage: Secondary | ICD-10-CM | POA: Diagnosis not present

## 2021-09-16 DIAGNOSIS — Z79899 Other long term (current) drug therapy: Secondary | ICD-10-CM | POA: Diagnosis not present

## 2021-09-16 DIAGNOSIS — H401113 Primary open-angle glaucoma, right eye, severe stage: Secondary | ICD-10-CM | POA: Diagnosis not present

## 2021-09-16 DIAGNOSIS — Z9883 Filtering (vitreous) bleb after glaucoma surgery status: Secondary | ICD-10-CM | POA: Diagnosis not present

## 2021-09-16 DIAGNOSIS — Z48810 Encounter for surgical aftercare following surgery on the sense organs: Secondary | ICD-10-CM | POA: Diagnosis not present

## 2021-09-21 ENCOUNTER — Other Ambulatory Visit: Payer: Medicare HMO

## 2021-09-21 DIAGNOSIS — M25512 Pain in left shoulder: Secondary | ICD-10-CM | POA: Diagnosis not present

## 2021-09-21 DIAGNOSIS — M25569 Pain in unspecified knee: Secondary | ICD-10-CM | POA: Diagnosis not present

## 2021-09-21 DIAGNOSIS — M545 Low back pain, unspecified: Secondary | ICD-10-CM | POA: Diagnosis not present

## 2021-09-21 DIAGNOSIS — Z79891 Long term (current) use of opiate analgesic: Secondary | ICD-10-CM | POA: Diagnosis not present

## 2021-09-21 DIAGNOSIS — M25552 Pain in left hip: Secondary | ICD-10-CM | POA: Diagnosis not present

## 2021-09-21 DIAGNOSIS — M5416 Radiculopathy, lumbar region: Secondary | ICD-10-CM | POA: Diagnosis not present

## 2021-09-22 DIAGNOSIS — R001 Bradycardia, unspecified: Secondary | ICD-10-CM | POA: Diagnosis not present

## 2021-09-22 DIAGNOSIS — K219 Gastro-esophageal reflux disease without esophagitis: Secondary | ICD-10-CM | POA: Diagnosis not present

## 2021-09-22 DIAGNOSIS — R739 Hyperglycemia, unspecified: Secondary | ICD-10-CM | POA: Diagnosis not present

## 2021-09-22 DIAGNOSIS — Z6832 Body mass index (BMI) 32.0-32.9, adult: Secondary | ICD-10-CM | POA: Diagnosis not present

## 2021-09-22 DIAGNOSIS — F17210 Nicotine dependence, cigarettes, uncomplicated: Secondary | ICD-10-CM | POA: Diagnosis not present

## 2021-09-22 DIAGNOSIS — E119 Type 2 diabetes mellitus without complications: Secondary | ICD-10-CM | POA: Diagnosis not present

## 2021-09-22 DIAGNOSIS — Z0001 Encounter for general adult medical examination with abnormal findings: Secondary | ICD-10-CM | POA: Diagnosis not present

## 2021-09-22 DIAGNOSIS — F419 Anxiety disorder, unspecified: Secondary | ICD-10-CM | POA: Diagnosis not present

## 2021-09-23 ENCOUNTER — Other Ambulatory Visit: Payer: Medicare HMO

## 2021-09-23 ENCOUNTER — Other Ambulatory Visit: Payer: Self-pay

## 2021-09-23 DIAGNOSIS — R972 Elevated prostate specific antigen [PSA]: Secondary | ICD-10-CM | POA: Diagnosis not present

## 2021-09-24 LAB — PSA, TOTAL AND FREE
PSA, Free Pct: 30.3 %
PSA, Free: 0.94 ng/mL
Prostate Specific Ag, Serum: 3.1 ng/mL (ref 0.0–4.0)

## 2021-09-28 ENCOUNTER — Ambulatory Visit (INDEPENDENT_AMBULATORY_CARE_PROVIDER_SITE_OTHER): Payer: Medicare HMO | Admitting: Urology

## 2021-09-28 ENCOUNTER — Other Ambulatory Visit: Payer: Self-pay

## 2021-09-28 ENCOUNTER — Encounter: Payer: Self-pay | Admitting: Urology

## 2021-09-28 VITALS — BP 109/66 | HR 60

## 2021-09-28 DIAGNOSIS — N5201 Erectile dysfunction due to arterial insufficiency: Secondary | ICD-10-CM

## 2021-09-28 DIAGNOSIS — R972 Elevated prostate specific antigen [PSA]: Secondary | ICD-10-CM | POA: Diagnosis not present

## 2021-09-28 LAB — URINALYSIS, ROUTINE W REFLEX MICROSCOPIC
Bilirubin, UA: NEGATIVE
Glucose, UA: NEGATIVE
Leukocytes,UA: NEGATIVE
Nitrite, UA: NEGATIVE
Protein,UA: NEGATIVE
RBC, UA: NEGATIVE
Specific Gravity, UA: 1.025 (ref 1.005–1.030)
Urobilinogen, Ur: 0.2 mg/dL (ref 0.2–1.0)
pH, UA: 5.5 (ref 5.0–7.5)

## 2021-09-28 MED ORDER — VARDENAFIL HCL 20 MG PO TABS: 20.0000 mg | ORAL_TABLET | Freq: Every day | ORAL | 0 refills | Status: DC | PRN

## 2021-09-28 NOTE — Patient Instructions (Signed)
Erectile Dysfunction °Erectile dysfunction (ED) is the inability to get or keep an erection in order to have sexual intercourse. ED is considered a symptom of an underlying disorder and is not considered a disease. ED may include: °Inability to get an erection. °Lack of enough hardness of the erection to allow penetration. °Loss of erection before sex is finished. °What are the causes? °This condition may be caused by: °Physical causes, such as: °Artery problems. This may include heart disease, high blood pressure, atherosclerosis, and diabetes. °Hormonal problems, such as low testosterone. °Obesity. °Nerve problems. This may include back or pelvic injuries, multiple sclerosis, Parkinson's disease, spinal cord injury, and stroke. °Certain medicines, such as: °Pain relievers. °Antidepressants. °Blood pressure medicines and water pills (diuretics). °Cancer medicines. °Antihistamines. °Muscle relaxants. °Lifestyle factors, such as: °Use of drugs such as marijuana, cocaine, or opioids. °Excessive use of alcohol. °Smoking. °Lack of physical activity or exercise. °Psychological causes, such as: °Anxiety or stress. °Sadness or depression. °Exhaustion. °Fear about sexual performance. °Guilt. °What are the signs or symptoms? °Symptoms of this condition include: °Inability to get an erection. °Lack of enough hardness of the erection to allow penetration. °Loss of the erection before sex is finished. °Sometimes having normal erections, but with frequent unsatisfactory episodes. °Low sexual satisfaction in either partner due to erection problems. °A curved penis occurring with erection. The curve may cause pain, or the penis may be too curved to allow for intercourse. °Never having nighttime or morning erections. °How is this diagnosed? °This condition is often diagnosed by: °Performing a physical exam to find other diseases or specific problems with the penis. °Asking you detailed questions about the problem. °Doing tests,  such as: °Blood tests to check for diabetes mellitus or high cholesterol, or to measure hormone levels. °Other tests to check for underlying health conditions. °An ultrasound exam to check for scarring. °A test to check blood flow to the penis. °Doing a sleep study at home to measure nighttime erections. °How is this treated? °This condition may be treated by: °Medicines, such as: °Medicine taken by mouth to help you achieve an erection (oral medicine). °Hormone replacement therapy to replace low testosterone levels. °Medicine that is injected into the penis. Your health care provider may instruct you how to give yourself these injections at home. °Medicine that is delivered with a short applicator tube. The tube is inserted into the opening at the tip of the penis, which is the opening of the urethra. A tiny pellet of medicine is put in the urethra. The pellet dissolves and enhances erectile function. This is also called MUSE (medicated urethral system for erections) therapy. °Vacuum pump. This is a pump with a ring on it. The pump and ring are placed on the penis and used to create pressure that helps the penis become erect. °Penile implant surgery. In this procedure, you may receive: °An inflatable implant. This consists of cylinders, a pump, and a reservoir. The cylinders can be inflated with a fluid that helps to create an erection, and they can be deflated after intercourse. °A semi-rigid implant. This consists of two silicone rubber rods. The rods provide some rigidity. They are also flexible, so the penis can both curve downward in its normal position and become straight for sexual intercourse. °Blood vessel surgery to improve blood flow to the penis. During this procedure, a blood vessel from a different part of the body is placed into the penis to allow blood to flow around (bypass) damaged or blocked blood vessels. °Lifestyle changes,   such as exercising more, losing weight, and quitting smoking. °Follow  these instructions at home: °Medicines ° °Take over-the-counter and prescription medicines only as told by your health care provider. Do not increase the dosage without first discussing it with your health care provider. °If you are using self-injections, do injections as directed by your health care provider. Make sure you avoid any veins that are on the surface of the penis. After giving an injection, apply pressure to the injection site for 5 minutes. °Talk to your health care provider about how to prevent headaches while taking ED medicines. These medicines may cause a sudden headache due to the increase in blood flow in your body. °General instructions °Exercise regularly, as directed by your health care provider. Work with your health care provider to lose weight, if needed. °Do not use any products that contain nicotine or tobacco. These products include cigarettes, chewing tobacco, and vaping devices, such as e-cigarettes. If you need help quitting, ask your health care provider. °Before using a vacuum pump, read the instructions that come with the pump and discuss any questions with your health care provider. °Keep all follow-up visits. This is important. °Contact a health care provider if: °You feel nauseous. °You are vomiting. °You get sudden headaches while taking ED medicines. °You have any concerns about your sexual health. °Get help right away if: °You are taking oral or injectable medicines and you have an erection that lasts longer than 4 hours. If your health care provider is unavailable, go to the nearest emergency room for evaluation. An erection that lasts much longer than 4 hours can result in permanent damage to your penis. °You have severe pain in your groin or abdomen. °You develop redness or severe swelling of your penis. °You have redness spreading at your groin or lower abdomen. °You are unable to urinate. °You experience chest pain or a rapid heartbeat (palpitations) after taking oral  medicines. °These symptoms may represent a serious problem that is an emergency. Do not wait to see if the symptoms will go away. Get medical help right away. Call your local emergency services (911 in the [place] Do not drive yourself to the hospital. °Summary °Erectile dysfunction (ED) is the inability to get or keep an erection during sexual intercourse. °This condition is diagnosed based on a physical exam, your symptoms, and tests to determine the cause. Treatment varies depending on the cause and may include medicines, hormone therapy, surgery, or a vacuum pump. °You may need follow-up visits to make sure that you are using your medicines or devices correctly. °Get help right away if you are taking or injecting medicines and you have an erection that lasts longer than 4 hours. °This information is not intended to replace advice given to you by your health care provider. Make sure you discuss any questions you have with your health care provider. °Document Revised: 09/17/2020 Document Reviewed: 09/17/2020 °Elsevier Patient Education © 2022 [employer] ° °

## 2021-09-28 NOTE — Progress Notes (Signed)
? ?09/28/2021 ?11:08 AM  ? ?Steven Richard ?1955/12/12 ?[id number] ? ?Referring provider: Curlene Labrum, MD ?[address] ?[place],  [place] [address] ? ?Followup elevated PSA and Erectile dysfunction ? ? ?HPI: ?Mr Steven Richard is a 66yo here for followup for elevated PSA and erectile dysfunction. PSA decreased to 3.1 from 4.2 IPSS 4 QOl 1 on no BPH therapy. He tried sildenafil which failed to give him a firm erection. He has tried cilais in the past which gave him a headache. No other complaints today.  ? ? ?PMH: ?Past Medical History:  ?Diagnosis Date  ? Arthritis   ? Chronic back pain   ? GERD (gastroesophageal reflux disease)   ? Glaucoma   ? History of kidney stones   ? history of  ? ? ?Surgical History: ?Past Surgical History:  ?Procedure Laterality Date  ? CATARACT EXTRACTION W/PHACO Right 06/01/2016  ? Procedure: CATARACT EXTRACTION PHACO AND INTRAOCULAR LENS PLACEMENT (IOC);  Surgeon: Rutherford Guys, MD;  Location: [facility];  Service: Ophthalmology;  Laterality: Right;  CDE:  4.34  ? CATARACT EXTRACTION W/PHACO Left 06/15/2016  ? Procedure: CATARACT EXTRACTION PHACO AND INTRAOCULAR LENS PLACEMENT (IOC);  Surgeon: Rutherford Guys, MD;  Location: [facility];  Service: Ophthalmology;  Laterality: Left;  CDE: 4.37  ? EYE SURGERY    ? ORCHIECTOMY Left   ? ruptured testicle  ? SHOULDER ARTHROSCOPY WITH ROTATOR CUFF REPAIR Right 02/14/2020  ? Procedure: Right Shoulder arthroscopy, subacromial decompression, distal clavicle resection rotator cuff repair;  Surgeon: Justice Britain, MD;  Location: [facility];  Service: Orthopedics;  Laterality: Right;  ? TYMPANOPLASTY Right   ? with pathching  ? ? ?Home Medications:  ?Allergies as of 09/28/2021   ? ?   Reactions  ? Netarsudil-latanoprost Other (See Comments)  ? Irritated eyes   ? ?  ? ?  ?Medication List  ?  ? ?  ? Accurate as of September 28, 2021 11:08 AM. If you have any questions, ask your nurse or doctor.  ?  ?  ? ?  ? ?ALPRAZolam 1 MG tablet ?Commonly known as: Duanne Moron ?Take 1 mg by mouth at  bedtime. ?  ?cyclobenzaprine 10 MG tablet ?Commonly known as: FLEXERIL ?Take 1 tablet (10 mg total) by mouth 3 (three) times daily as needed for muscle spasms. ?  ?cycloSPORINE 0.05 % ophthalmic emulsion ?Commonly known as: RESTASIS ?Place 1 drop into both eyes 2 (two) times daily. ?  ?Dexilant 60 MG capsule ?Generic drug: dexlansoprazole ?Take 60 mg by mouth daily before breakfast. ?  ?dorzolamide 2 % ophthalmic solution ?Commonly known as: TRUSOPT ?Place 1 drop into both eyes 2 (two) times daily. ?  ?DULoxetine 30 MG capsule ?Commonly known as: CYMBALTA ?Take 30 mg by mouth daily. ?  ?HYDROmorphone 2 MG tablet ?Commonly known as: Dilaudid ?Take 1-2 tablets (2-4 mg total) by mouth every 4 (four) hours as needed (prn post op pain not covered by your chronic oxycodone). ?  ?Linzess 72 MCG capsule ?Generic drug: linaclotide ?Take 72 mcg by mouth at bedtime. ?  ?meloxicam 15 MG tablet ?Commonly known as: MOBIC ?Take 1 tablet (15 mg total) by mouth daily. ?  ?ondansetron 4 MG tablet ?Commonly known as: Zofran ?Take 1 tablet (4 mg total) by mouth every 8 (eight) hours as needed for nausea or vomiting. ?  ?oxyCODONE-acetaminophen 10-325 MG tablet ?Commonly known as: PERCOCET ?Take 1 tablet by mouth 2 (two) times daily. ?  ?PARoxetine 20 MG tablet ?Commonly known as: PAXIL ?Take 20 mg by  mouth at bedtime. ?  ?pregabalin 150 MG capsule ?Commonly known as: LYRICA ?Take 150 mg by mouth in the morning, at noon, and at bedtime. ?  ?sildenafil 100 MG tablet ?Commonly known as: VIAGRA ?Take 1 tablet (100 mg total) by mouth daily as needed for erectile dysfunction. ?  ?terbinafine 250 MG tablet ?Commonly known as: LAMISIL ?Take 250 mg by mouth daily. ?  ?Travoprost (BAK Free) 0.004 % Soln ophthalmic solution ?Commonly known as: TRAVATAN ?Place 1 drop into both eyes at bedtime. ?  ?traZODone 100 MG tablet ?Commonly known as: DESYREL ?Take 100 mg by mouth at bedtime. ?  ? ?  ? ? ?Allergies:  ?Allergies  ?Allergen Reactions  ?  Netarsudil-Latanoprost Other (See Comments)  ?  Irritated eyes   ? ? ?Family History: ?Family History  ?Problem Relation Age of Onset  ? Diabetes Mother   ? Dementia Mother   ? Diabetes Father   ? ? ?Social History:  reports that he quit smoking about 53 years ago. His smoking use included cigarettes. He has a 5.00 pack-year smoking history. He has never used smokeless tobacco. He reports that he does not drink alcohol and does not use drugs. ? ?ROS: ?All other review of systems were reviewed and are negative except what is noted above in HPI ? ?Physical Exam: ?BP 109/66   Pulse 60   ?Constitutional:  Alert and oriented, No acute distress. ?HEENT: [place] AT, moist mucus membranes.  Trachea midline, no masses. ?Cardiovascular: No clubbing, cyanosis, or edema. ?Respiratory: Normal respiratory effort, no increased work of breathing. ?GI: Abdomen is soft, nontender, nondistended, no abdominal masses ?GU: No CVA tenderness.  ?Lymph: No cervical or inguinal lymphadenopathy. ?Skin: No rashes, bruises or suspicious lesions. ?Neurologic: Grossly intact, no focal deficits, moving all 4 extremities. ?Psychiatric: Normal mood and affect. ? ?Laboratory Data: ?Lab Results  ?Component Value Date  ? WBC 7.6 02/11/2020  ? HGB 14.8 02/11/2020  ? HCT 46.4 02/11/2020  ? MCV 89.6 02/11/2020  ? PLT 233 02/11/2020  ? ? ?Lab Results  ?Component Value Date  ? CREATININE 1.19 05/26/2016  ? ? ?No results found for: PSA ? ?No results found for: TESTOSTERONE ? ?No results found for: HGBA1C ? ?Urinalysis ?   ?Component Value Date/Time  ? APPEARANCEUR Clear 03/25/2021 0852  ? GLUCOSEU Negative 03/25/2021 0852  ? BILIRUBINUR Negative 03/25/2021 0852  ? PROTEINUR 1+ (A) 03/25/2021 4496  ? NITRITE Negative 03/25/2021 0852  ? LEUKOCYTESUR Negative 03/25/2021 0852  ? ? ?Lab Results  ?Component Value Date  ? LABMICR See below: 03/25/2021  ? [place] None seen 03/25/2021  ? LABEPIT None seen 03/25/2021  ? BACTERIA Few (A) 03/25/2021  ? ? ?Pertinent  Imaging: ? ?No results found for this or any previous visit. ? ?No results found for this or any previous visit. ? ?No results found for this or any previous visit. ? ?No results found for this or any previous visit. ? ?No results found for this or any previous visit. ? ?No results found for this or any previous visit. ? ?No results found for this or any previous visit. ? ?No results found for this or any previous visit. ? ? ?Assessment & Plan:   ? ?1. Elevated PSA ?-RTC 1 year with PSA ?- Urinalysis, Routine w reflex microscopic ? ?2. Erectile dysfunction due to arterial insufficiency ?-We will trial levitra. If this fails to improve his erectile dysfunction then we will proceed with trimix injection. ? ? ?No follow-ups on file. ? ?Saralyn Pilar  Syble Picco, MD ? ?[facility] ?  ?

## 2021-10-06 DIAGNOSIS — Z961 Presence of intraocular lens: Secondary | ICD-10-CM | POA: Diagnosis not present

## 2021-10-06 DIAGNOSIS — H3581 Retinal edema: Secondary | ICD-10-CM | POA: Diagnosis not present

## 2021-10-06 DIAGNOSIS — H401113 Primary open-angle glaucoma, right eye, severe stage: Secondary | ICD-10-CM | POA: Diagnosis not present

## 2021-10-06 DIAGNOSIS — H401122 Primary open-angle glaucoma, left eye, moderate stage: Secondary | ICD-10-CM | POA: Diagnosis not present

## 2021-10-06 DIAGNOSIS — H4423 Degenerative myopia, bilateral: Secondary | ICD-10-CM | POA: Diagnosis not present

## 2021-10-06 NOTE — Progress Notes (Signed)
Letter sent.

## 2021-11-26 DIAGNOSIS — H409 Unspecified glaucoma: Secondary | ICD-10-CM | POA: Diagnosis not present

## 2021-11-26 DIAGNOSIS — F32A Depression, unspecified: Secondary | ICD-10-CM | POA: Diagnosis not present

## 2021-11-26 DIAGNOSIS — K635 Polyp of colon: Secondary | ICD-10-CM | POA: Diagnosis not present

## 2021-11-26 DIAGNOSIS — Z1211 Encounter for screening for malignant neoplasm of colon: Secondary | ICD-10-CM | POA: Diagnosis not present

## 2021-11-26 DIAGNOSIS — E669 Obesity, unspecified: Secondary | ICD-10-CM | POA: Diagnosis not present

## 2021-11-26 DIAGNOSIS — D125 Benign neoplasm of sigmoid colon: Secondary | ICD-10-CM | POA: Diagnosis not present

## 2021-11-26 DIAGNOSIS — D126 Benign neoplasm of colon, unspecified: Secondary | ICD-10-CM | POA: Diagnosis not present

## 2021-11-26 DIAGNOSIS — D129 Benign neoplasm of anus and anal canal: Secondary | ICD-10-CM | POA: Diagnosis not present

## 2021-11-26 DIAGNOSIS — M79606 Pain in leg, unspecified: Secondary | ICD-10-CM | POA: Diagnosis not present

## 2021-11-26 DIAGNOSIS — Z6832 Body mass index (BMI) 32.0-32.9, adult: Secondary | ICD-10-CM | POA: Diagnosis not present

## 2021-11-26 DIAGNOSIS — Z87442 Personal history of urinary calculi: Secondary | ICD-10-CM | POA: Diagnosis not present

## 2021-11-26 DIAGNOSIS — F419 Anxiety disorder, unspecified: Secondary | ICD-10-CM | POA: Diagnosis not present

## 2021-12-14 DIAGNOSIS — M5416 Radiculopathy, lumbar region: Secondary | ICD-10-CM | POA: Diagnosis not present

## 2021-12-14 DIAGNOSIS — I951 Orthostatic hypotension: Secondary | ICD-10-CM | POA: Diagnosis not present

## 2021-12-14 DIAGNOSIS — Z79891 Long term (current) use of opiate analgesic: Secondary | ICD-10-CM | POA: Diagnosis not present

## 2021-12-14 DIAGNOSIS — M25552 Pain in left hip: Secondary | ICD-10-CM | POA: Diagnosis not present

## 2021-12-14 DIAGNOSIS — M545 Low back pain, unspecified: Secondary | ICD-10-CM | POA: Diagnosis not present

## 2021-12-14 DIAGNOSIS — M25512 Pain in left shoulder: Secondary | ICD-10-CM | POA: Diagnosis not present

## 2021-12-16 DIAGNOSIS — D126 Benign neoplasm of colon, unspecified: Secondary | ICD-10-CM | POA: Diagnosis not present

## 2021-12-23 DIAGNOSIS — H52203 Unspecified astigmatism, bilateral: Secondary | ICD-10-CM | POA: Diagnosis not present

## 2021-12-23 DIAGNOSIS — H524 Presbyopia: Secondary | ICD-10-CM | POA: Diagnosis not present

## 2021-12-30 DIAGNOSIS — H401113 Primary open-angle glaucoma, right eye, severe stage: Secondary | ICD-10-CM | POA: Diagnosis not present

## 2021-12-30 DIAGNOSIS — H401122 Primary open-angle glaucoma, left eye, moderate stage: Secondary | ICD-10-CM | POA: Diagnosis not present

## 2022-01-08 DIAGNOSIS — M25562 Pain in left knee: Secondary | ICD-10-CM | POA: Diagnosis not present

## 2022-01-08 DIAGNOSIS — G894 Chronic pain syndrome: Secondary | ICD-10-CM | POA: Diagnosis not present

## 2022-01-08 DIAGNOSIS — M25561 Pain in right knee: Secondary | ICD-10-CM | POA: Diagnosis not present

## 2022-01-08 DIAGNOSIS — M9979 Connective tissue and disc stenosis of intervertebral foramina of abdomen and other regions: Secondary | ICD-10-CM | POA: Diagnosis not present

## 2022-01-08 DIAGNOSIS — E119 Type 2 diabetes mellitus without complications: Secondary | ICD-10-CM | POA: Diagnosis not present

## 2022-01-08 DIAGNOSIS — Z6832 Body mass index (BMI) 32.0-32.9, adult: Secondary | ICD-10-CM | POA: Diagnosis not present

## 2022-01-08 DIAGNOSIS — R42 Dizziness and giddiness: Secondary | ICD-10-CM | POA: Diagnosis not present

## 2022-01-18 DIAGNOSIS — H524 Presbyopia: Secondary | ICD-10-CM | POA: Diagnosis not present

## 2022-01-28 DIAGNOSIS — H401113 Primary open-angle glaucoma, right eye, severe stage: Secondary | ICD-10-CM | POA: Diagnosis not present

## 2022-01-28 DIAGNOSIS — H401122 Primary open-angle glaucoma, left eye, moderate stage: Secondary | ICD-10-CM | POA: Diagnosis not present

## 2022-03-09 DIAGNOSIS — I951 Orthostatic hypotension: Secondary | ICD-10-CM | POA: Diagnosis not present

## 2022-03-09 DIAGNOSIS — Z79891 Long term (current) use of opiate analgesic: Secondary | ICD-10-CM | POA: Diagnosis not present

## 2022-03-09 DIAGNOSIS — M25512 Pain in left shoulder: Secondary | ICD-10-CM | POA: Diagnosis not present

## 2022-03-09 DIAGNOSIS — M545 Low back pain, unspecified: Secondary | ICD-10-CM | POA: Diagnosis not present

## 2022-03-09 DIAGNOSIS — M25552 Pain in left hip: Secondary | ICD-10-CM | POA: Diagnosis not present

## 2022-03-09 DIAGNOSIS — M5416 Radiculopathy, lumbar region: Secondary | ICD-10-CM | POA: Diagnosis not present

## 2022-05-25 DIAGNOSIS — H4423 Degenerative myopia, bilateral: Secondary | ICD-10-CM | POA: Diagnosis not present

## 2022-05-25 DIAGNOSIS — H401133 Primary open-angle glaucoma, bilateral, severe stage: Secondary | ICD-10-CM | POA: Diagnosis not present

## 2022-05-25 DIAGNOSIS — H401113 Primary open-angle glaucoma, right eye, severe stage: Secondary | ICD-10-CM | POA: Diagnosis not present

## 2022-05-25 DIAGNOSIS — H401123 Primary open-angle glaucoma, left eye, severe stage: Secondary | ICD-10-CM | POA: Diagnosis not present

## 2022-06-01 DIAGNOSIS — M25512 Pain in left shoulder: Secondary | ICD-10-CM | POA: Diagnosis not present

## 2022-06-01 DIAGNOSIS — I951 Orthostatic hypotension: Secondary | ICD-10-CM | POA: Diagnosis not present

## 2022-06-01 DIAGNOSIS — M545 Low back pain, unspecified: Secondary | ICD-10-CM | POA: Diagnosis not present

## 2022-06-01 DIAGNOSIS — M5416 Radiculopathy, lumbar region: Secondary | ICD-10-CM | POA: Diagnosis not present

## 2022-06-01 DIAGNOSIS — M25552 Pain in left hip: Secondary | ICD-10-CM | POA: Diagnosis not present

## 2022-06-01 DIAGNOSIS — Z79891 Long term (current) use of opiate analgesic: Secondary | ICD-10-CM | POA: Diagnosis not present

## 2022-06-14 DIAGNOSIS — H401113 Primary open-angle glaucoma, right eye, severe stage: Secondary | ICD-10-CM | POA: Diagnosis not present

## 2022-06-14 DIAGNOSIS — H401122 Primary open-angle glaucoma, left eye, moderate stage: Secondary | ICD-10-CM | POA: Diagnosis not present

## 2022-07-19 DIAGNOSIS — H6121 Impacted cerumen, right ear: Secondary | ICD-10-CM | POA: Diagnosis not present

## 2022-07-19 DIAGNOSIS — Z6831 Body mass index (BMI) 31.0-31.9, adult: Secondary | ICD-10-CM | POA: Diagnosis not present

## 2022-07-19 DIAGNOSIS — R972 Elevated prostate specific antigen [PSA]: Secondary | ICD-10-CM | POA: Diagnosis not present

## 2022-07-19 DIAGNOSIS — E119 Type 2 diabetes mellitus without complications: Secondary | ICD-10-CM | POA: Diagnosis not present

## 2022-07-19 DIAGNOSIS — F419 Anxiety disorder, unspecified: Secondary | ICD-10-CM | POA: Diagnosis not present

## 2022-07-19 DIAGNOSIS — J019 Acute sinusitis, unspecified: Secondary | ICD-10-CM | POA: Diagnosis not present

## 2022-07-19 DIAGNOSIS — R351 Nocturia: Secondary | ICD-10-CM | POA: Diagnosis not present

## 2022-07-19 DIAGNOSIS — M9979 Connective tissue and disc stenosis of intervertebral foramina of abdomen and other regions: Secondary | ICD-10-CM | POA: Diagnosis not present

## 2022-07-19 DIAGNOSIS — N401 Enlarged prostate with lower urinary tract symptoms: Secondary | ICD-10-CM | POA: Diagnosis not present

## 2022-07-19 DIAGNOSIS — G894 Chronic pain syndrome: Secondary | ICD-10-CM | POA: Diagnosis not present

## 2022-08-17 DIAGNOSIS — Z79891 Long term (current) use of opiate analgesic: Secondary | ICD-10-CM | POA: Diagnosis not present

## 2022-08-17 DIAGNOSIS — G894 Chronic pain syndrome: Secondary | ICD-10-CM | POA: Diagnosis not present

## 2022-08-17 DIAGNOSIS — Z79899 Other long term (current) drug therapy: Secondary | ICD-10-CM | POA: Diagnosis not present

## 2022-09-14 DIAGNOSIS — G894 Chronic pain syndrome: Secondary | ICD-10-CM | POA: Diagnosis not present

## 2022-09-14 DIAGNOSIS — M25519 Pain in unspecified shoulder: Secondary | ICD-10-CM | POA: Diagnosis not present

## 2022-09-14 DIAGNOSIS — F419 Anxiety disorder, unspecified: Secondary | ICD-10-CM | POA: Diagnosis not present

## 2022-09-14 DIAGNOSIS — M5451 Vertebrogenic low back pain: Secondary | ICD-10-CM | POA: Diagnosis not present

## 2022-09-20 DIAGNOSIS — F411 Generalized anxiety disorder: Secondary | ICD-10-CM | POA: Diagnosis not present

## 2022-09-20 DIAGNOSIS — F331 Major depressive disorder, recurrent, moderate: Secondary | ICD-10-CM | POA: Diagnosis not present

## 2022-09-24 ENCOUNTER — Other Ambulatory Visit: Payer: Medicare HMO

## 2022-10-01 ENCOUNTER — Ambulatory Visit: Payer: Medicare HMO | Admitting: Urology

## 2022-10-04 DIAGNOSIS — H401113 Primary open-angle glaucoma, right eye, severe stage: Secondary | ICD-10-CM | POA: Diagnosis not present

## 2022-10-04 DIAGNOSIS — H401122 Primary open-angle glaucoma, left eye, moderate stage: Secondary | ICD-10-CM | POA: Diagnosis not present

## 2022-10-07 ENCOUNTER — Other Ambulatory Visit: Payer: Medicare HMO

## 2022-10-07 ENCOUNTER — Other Ambulatory Visit: Payer: Self-pay

## 2022-10-07 DIAGNOSIS — R972 Elevated prostate specific antigen [PSA]: Secondary | ICD-10-CM | POA: Diagnosis not present

## 2022-10-08 LAB — PSA: Prostate Specific Ag, Serum: 3.9 ng/mL (ref 0.0–4.0)

## 2022-10-12 DIAGNOSIS — Z79899 Other long term (current) drug therapy: Secondary | ICD-10-CM | POA: Diagnosis not present

## 2022-10-12 DIAGNOSIS — G894 Chronic pain syndrome: Secondary | ICD-10-CM | POA: Diagnosis not present

## 2022-10-12 DIAGNOSIS — M5451 Vertebrogenic low back pain: Secondary | ICD-10-CM | POA: Diagnosis not present

## 2022-10-12 DIAGNOSIS — M25519 Pain in unspecified shoulder: Secondary | ICD-10-CM | POA: Diagnosis not present

## 2022-10-12 DIAGNOSIS — Z79891 Long term (current) use of opiate analgesic: Secondary | ICD-10-CM | POA: Diagnosis not present

## 2022-10-12 DIAGNOSIS — F419 Anxiety disorder, unspecified: Secondary | ICD-10-CM | POA: Diagnosis not present

## 2022-10-15 ENCOUNTER — Encounter: Payer: Self-pay | Admitting: Urology

## 2022-10-15 ENCOUNTER — Ambulatory Visit: Payer: Medicare PPO | Admitting: Urology

## 2022-10-15 VITALS — BP 123/85 | HR 79

## 2022-10-15 DIAGNOSIS — N5201 Erectile dysfunction due to arterial insufficiency: Secondary | ICD-10-CM

## 2022-10-15 DIAGNOSIS — R972 Elevated prostate specific antigen [PSA]: Secondary | ICD-10-CM | POA: Diagnosis not present

## 2022-10-15 LAB — URINALYSIS, ROUTINE W REFLEX MICROSCOPIC
Bilirubin, UA: NEGATIVE
Glucose, UA: NEGATIVE
Ketones, UA: NEGATIVE
Leukocytes,UA: NEGATIVE
Nitrite, UA: NEGATIVE
Protein,UA: NEGATIVE
RBC, UA: NEGATIVE
Specific Gravity, UA: 1.025 (ref 1.005–1.030)
Urobilinogen, Ur: 0.2 mg/dL (ref 0.2–1.0)
pH, UA: 5 (ref 5.0–7.5)

## 2022-10-15 MED ORDER — AMBULATORY NON FORMULARY MEDICATION: 0.2000 mL | 5 refills | Status: DC | PRN

## 2022-10-15 NOTE — Progress Notes (Unsigned)
10/15/2022 12:22 PM   Steven Richard [birth year] [id number]  Referring provider: Juliette Alcide, MD [address] [place],  [place] [address]  Followup elevated PSA and Erectile dysfunction  HPI: Steven Richard is a 67yo here for followup for ED and elevated PSA. PSA increased to 3.9 from 3.1. IPSS 16 QOL 2. He tried levitra which failed to give him a firm erection. He has tried and failed sildenafil and tadalafil.    PMH: Past Medical History:  Diagnosis Date   Arthritis    Chronic back pain    GERD (gastroesophageal reflux disease)    Glaucoma    History of kidney stones    history of    Surgical History: Past Surgical History:  Procedure Laterality Date   CATARACT EXTRACTION W/PHACO Right 06/01/2016   Procedure: CATARACT EXTRACTION PHACO AND INTRAOCULAR LENS PLACEMENT (IOC);  Surgeon: Jethro Bolus, MD;  Location: [facility];  Service: Ophthalmology;  Laterality: Right;  CDE:  4.34   CATARACT EXTRACTION W/PHACO Left 06/15/2016   Procedure: CATARACT EXTRACTION PHACO AND INTRAOCULAR LENS PLACEMENT (IOC);  Surgeon: Jethro Bolus, MD;  Location: [facility];  Service: Ophthalmology;  Laterality: Left;  CDE: 4.37   EYE SURGERY     ORCHIECTOMY Left    ruptured testicle   SHOULDER ARTHROSCOPY WITH ROTATOR CUFF REPAIR Right 02/14/2020   Procedure: Right Shoulder arthroscopy, subacromial decompression, distal clavicle resection rotator cuff repair;  Surgeon: Francena Hanly, MD;  Location: [facility];  Service: Orthopedics;  Laterality: Right;   TYMPANOPLASTY Right    with pathching    Home Medications:  Allergies as of 10/15/2022       Reactions   Netarsudil-latanoprost Other (See Comments)   Irritated eyes         Medication List        Accurate as of October 15, 2022 12:22 PM. If you have any questions, ask your nurse or doctor.          ALPRAZolam 1 MG tablet Commonly known as: XANAX Take 1 mg by mouth at bedtime.   cyclobenzaprine 10 MG tablet Commonly known as: FLEXERIL Take 1  tablet (10 mg total) by mouth 3 (three) times daily as needed for muscle spasms.   cycloSPORINE 0.05 % ophthalmic emulsion Commonly known as: RESTASIS Place 1 drop into both eyes 2 (two) times daily.   Dexilant 60 MG capsule Generic drug: dexlansoprazole Take 60 mg by mouth daily before breakfast.   dorzolamide 2 % ophthalmic solution Commonly known as: TRUSOPT Place 1 drop into both eyes 2 (two) times daily.   DULoxetine 30 MG capsule Commonly known as: CYMBALTA Take 30 mg by mouth daily.   HYDROmorphone 2 MG tablet Commonly known as: Dilaudid Take 1-2 tablets (2-4 mg total) by mouth every 4 (four) hours as needed (prn post op pain not covered by your chronic oxycodone).   Linzess 72 MCG capsule Generic drug: linaclotide Take 72 mcg by mouth at bedtime.   meloxicam 15 MG tablet Commonly known as: MOBIC Take 1 tablet (15 mg total) by mouth daily.   ondansetron 4 MG tablet Commonly known as: Zofran Take 1 tablet (4 mg total) by mouth every 8 (eight) hours as needed for nausea or vomiting.   oxyCODONE-acetaminophen 10-325 MG tablet Commonly known as: PERCOCET Take 1 tablet by mouth 2 (two) times daily.   PARoxetine 20 MG tablet Commonly known as: PAXIL Take 20 mg by mouth at bedtime.   pregabalin 150 MG capsule Commonly known as: LYRICA Take  150 mg by mouth in the morning, at noon, and at bedtime.   sildenafil 100 MG tablet Commonly known as: VIAGRA Take 1 tablet (100 mg total) by mouth daily as needed for erectile dysfunction.   terbinafine 250 MG tablet Commonly known as: LAMISIL Take 250 mg by mouth daily.   Travoprost (BAK Free) 0.004 % Soln ophthalmic solution Commonly known as: TRAVATAN Place 1 drop into both eyes at bedtime.   traZODone 100 MG tablet Commonly known as: DESYREL Take 100 mg by mouth at bedtime.   vardenafil 20 MG tablet Commonly known as: LEVITRA Take 1 tablet (20 mg total) by mouth daily as needed for erectile dysfunction.         Allergies:  Allergies  Allergen Reactions   Netarsudil-Latanoprost Other (See Comments)    Irritated eyes     Family History: Family History  Problem Relation Age of Onset   Diabetes Mother    Dementia Mother    Diabetes Father     Social History:  reports that he quit smoking about 54 years ago. His smoking use included cigarettes. He has a 5.00 pack-year smoking history. He has never used smokeless tobacco. He reports that he does not drink alcohol and does not use drugs.  ROS: All other review of systems were reviewed and are negative except what is noted above in HPI  Physical Exam: BP 123/85   Pulse 79   Constitutional:  Alert and oriented, No acute distress. HEENT: [place] AT, moist mucus membranes.  Trachea midline, no masses. Cardiovascular: No clubbing, cyanosis, or edema. Respiratory: Normal respiratory effort, no increased work of breathing. GI: Abdomen is soft, nontender, nondistended, no abdominal masses GU: No CVA tenderness.  Lymph: No cervical or inguinal lymphadenopathy. Skin: No rashes, bruises or suspicious lesions. Neurologic: Grossly intact, no focal deficits, moving all 4 extremities. Psychiatric: Normal mood and affect.  Laboratory Data: Lab Results  Component Value Date   WBC 7.6 02/11/2020   HGB 14.8 02/11/2020   HCT 46.4 02/11/2020   MCV 89.6 02/11/2020   PLT 233 02/11/2020    Lab Results  Component Value Date   CREATININE 1.19 05/26/2016    No results found for: "PSA"  No results found for: "TESTOSTERONE"  No results found for: "HGBA1C"  Urinalysis    Component Value Date/Time   APPEARANCEUR Clear 09/28/2021 1109   GLUCOSEU Negative 09/28/2021 1109   BILIRUBINUR Negative 09/28/2021 1109   PROTEINUR Negative 09/28/2021 1109   NITRITE Negative 09/28/2021 1109   LEUKOCYTESUR Negative 09/28/2021 1109    Lab Results  Component Value Date   LABMICR Comment 09/28/2021   WBCUA None seen 03/25/2021   LABEPIT None seen 03/25/2021    BACTERIA Few (A) 03/25/2021    Pertinent Imaging: *** No results found for this or any previous visit.  No results found for this or any previous visit.  No results found for this or any previous visit.  No results found for this or any previous visit.  No results found for this or any previous visit.  No valid procedures specified. No results found for this or any previous visit.  No results found for this or any previous visit.   Assessment & Plan:    1. Elevated PSA *** - Urinalysis, Routine w reflex microscopic  2. Erectile dysfunction due to arterial insufficiency ***   No follow-ups on file.  Wilkie Aye, MD  [facility] [place]

## 2022-10-15 NOTE — Patient Instructions (Signed)
Erectile Dysfunction ?Erectile dysfunction (ED) is the inability to get or keep an erection in order to have sexual intercourse. ED is considered a symptom of an underlying disorder and is not considered a disease. ED may include: ?Inability to get an erection. ?Lack of enough hardness of the erection to allow penetration. ?Loss of erection before sex is finished. ?What are the causes? ?This condition may be caused by: ?Physical causes, such as: ?Artery problems. This may include heart disease, high blood pressure, atherosclerosis, and diabetes. ?Hormonal problems, such as low testosterone. ?Obesity. ?Nerve problems. This may include back or pelvic injuries, multiple sclerosis, Parkinson's disease, spinal cord injury, and stroke. ?Certain medicines, such as: ?Pain relievers. ?Antidepressants. ?Blood pressure medicines and water pills (diuretics). ?Cancer medicines. ?Antihistamines. ?Muscle relaxants. ?Lifestyle factors, such as: ?Use of drugs such as marijuana, cocaine, or opioids. ?Excessive use of alcohol. ?Smoking. ?Lack of physical activity or exercise. ?Psychological causes, such as: ?Anxiety or stress. ?Sadness or depression. ?Exhaustion. ?Fear about sexual performance. ?Guilt. ?What are the signs or symptoms? ?Symptoms of this condition include: ?Inability to get an erection. ?Lack of enough hardness of the erection to allow penetration. ?Loss of the erection before sex is finished. ?Sometimes having normal erections, but with frequent unsatisfactory episodes. ?Low sexual satisfaction in either partner due to erection problems. ?A curved penis occurring with erection. The curve may cause pain, or the penis may be too curved to allow for intercourse. ?Never having nighttime or morning erections. ?How is this diagnosed? ?This condition is often diagnosed by: ?Performing a physical exam to find other diseases or specific problems with the penis. ?Asking you detailed questions about the problem. ?Doing tests,  such as: ?Blood tests to check for diabetes mellitus or high cholesterol, or to measure hormone levels. ?Other tests to check for underlying health conditions. ?An ultrasound exam to check for scarring. ?A test to check blood flow to the penis. ?Doing a sleep study at home to measure nighttime erections. ?How is this treated? ?This condition may be treated by: ?Medicines, such as: ?Medicine taken by mouth to help you achieve an erection (oral medicine). ?Hormone replacement therapy to replace low testosterone levels. ?Medicine that is injected into the penis. Your health care provider may instruct you how to give yourself these injections at home. ?Medicine that is delivered with a short applicator tube. The tube is inserted into the opening at the tip of the penis, which is the opening of the urethra. A tiny pellet of medicine is put in the urethra. The pellet dissolves and enhances erectile function. This is also called MUSE (medicated urethral system for erections) therapy. ?Vacuum pump. This is a pump with a ring on it. The pump and ring are placed on the penis and used to create pressure that helps the penis become erect. ?Penile implant surgery. In this procedure, you may receive: ?An inflatable implant. This consists of cylinders, a pump, and a reservoir. The cylinders can be inflated with a fluid that helps to create an erection, and they can be deflated after intercourse. ?A semi-rigid implant. This consists of two silicone rubber rods. The rods provide some rigidity. They are also flexible, so the penis can both curve downward in its normal position and become straight for sexual intercourse. ?Blood vessel surgery to improve blood flow to the penis. During this procedure, a blood vessel from a different part of the body is placed into the penis to allow blood to flow around (bypass) damaged or blocked blood vessels. ?Lifestyle changes,   such as exercising more, losing weight, and quitting smoking. ?Follow  these instructions at home: ?Medicines ? ?Take over-the-counter and prescription medicines only as told by your health care provider. Do not increase the dosage without first discussing it with your health care provider. ?If you are using self-injections, do injections as directed by your health care provider. Make sure you avoid any veins that are on the surface of the penis. After giving an injection, apply pressure to the injection site for 5 minutes. ?Talk to your health care provider about how to prevent headaches while taking ED medicines. These medicines may cause a sudden headache due to the increase in blood flow in your body. ?General instructions ?Exercise regularly, as directed by your health care provider. Work with your health care provider to lose weight, if needed. ?Do not use any products that contain nicotine or tobacco. These products include cigarettes, chewing tobacco, and vaping devices, such as e-cigarettes. If you need help quitting, ask your health care provider. ?Before using a vacuum pump, read the instructions that come with the pump and discuss any questions with your health care provider. ?Keep all follow-up visits. This is important. ?Contact a health care provider if: ?You feel nauseous. ?You are vomiting. ?You get sudden headaches while taking ED medicines. ?You have any concerns about your sexual health. ?Get help right away if: ?You are taking oral or injectable medicines and you have an erection that lasts longer than 4 hours. If your health care provider is unavailable, go to the nearest emergency room for evaluation. An erection that lasts much longer than 4 hours can result in permanent damage to your penis. ?You have severe pain in your groin or abdomen. ?You develop redness or severe swelling of your penis. ?You have redness spreading at your groin or lower abdomen. ?You are unable to urinate. ?You experience chest pain or a rapid heartbeat (palpitations) after taking oral  medicines. ?These symptoms may represent a serious problem that is an emergency. Do not wait to see if the symptoms will go away. Get medical help right away. Call your local emergency services (911 in the [place] Do not drive yourself to the hospital. ?Summary ?Erectile dysfunction (ED) is the inability to get or keep an erection during sexual intercourse. ?This condition is diagnosed based on a physical exam, your symptoms, and tests to determine the cause. Treatment varies depending on the cause and may include medicines, hormone therapy, surgery, or a vacuum pump. ?You may need follow-up visits to make sure that you are using your medicines or devices correctly. ?Get help right away if you are taking or injecting medicines and you have an erection that lasts longer than 4 hours. ?This information is not intended to replace advice given to you by your health care provider. Make sure you discuss any questions you have with your health care provider. ?Document Revised: 09/17/2020 Document Reviewed: 09/17/2020 ?Elsevier Patient Education ? 2023 [employer] ? ?

## 2022-10-21 DIAGNOSIS — H401122 Primary open-angle glaucoma, left eye, moderate stage: Secondary | ICD-10-CM | POA: Diagnosis not present

## 2022-10-21 DIAGNOSIS — H469 Unspecified optic neuritis: Secondary | ICD-10-CM | POA: Diagnosis not present

## 2022-10-21 DIAGNOSIS — H401113 Primary open-angle glaucoma, right eye, severe stage: Secondary | ICD-10-CM | POA: Diagnosis not present

## 2022-11-05 ENCOUNTER — Ambulatory Visit: Payer: Medicare PPO | Admitting: Urology

## 2022-11-05 DIAGNOSIS — N5201 Erectile dysfunction due to arterial insufficiency: Secondary | ICD-10-CM

## 2022-11-09 DIAGNOSIS — H401122 Primary open-angle glaucoma, left eye, moderate stage: Secondary | ICD-10-CM | POA: Diagnosis not present

## 2022-11-09 DIAGNOSIS — H4423 Degenerative myopia, bilateral: Secondary | ICD-10-CM | POA: Diagnosis not present

## 2022-11-09 DIAGNOSIS — H401113 Primary open-angle glaucoma, right eye, severe stage: Secondary | ICD-10-CM | POA: Diagnosis not present

## 2022-11-09 DIAGNOSIS — H469 Unspecified optic neuritis: Secondary | ICD-10-CM | POA: Diagnosis not present

## 2022-11-09 DIAGNOSIS — H3581 Retinal edema: Secondary | ICD-10-CM | POA: Diagnosis not present

## 2022-11-09 DIAGNOSIS — Z961 Presence of intraocular lens: Secondary | ICD-10-CM | POA: Diagnosis not present

## 2022-11-16 DIAGNOSIS — M5451 Vertebrogenic low back pain: Secondary | ICD-10-CM | POA: Diagnosis not present

## 2022-11-16 DIAGNOSIS — F419 Anxiety disorder, unspecified: Secondary | ICD-10-CM | POA: Diagnosis not present

## 2022-11-16 DIAGNOSIS — M545 Low back pain, unspecified: Secondary | ICD-10-CM | POA: Diagnosis not present

## 2022-11-16 DIAGNOSIS — G894 Chronic pain syndrome: Secondary | ICD-10-CM | POA: Diagnosis not present

## 2022-11-16 DIAGNOSIS — M25519 Pain in unspecified shoulder: Secondary | ICD-10-CM | POA: Diagnosis not present

## 2022-11-17 DIAGNOSIS — H401122 Primary open-angle glaucoma, left eye, moderate stage: Secondary | ICD-10-CM | POA: Diagnosis not present

## 2022-11-17 DIAGNOSIS — H469 Unspecified optic neuritis: Secondary | ICD-10-CM | POA: Diagnosis not present

## 2022-11-17 DIAGNOSIS — H401113 Primary open-angle glaucoma, right eye, severe stage: Secondary | ICD-10-CM | POA: Diagnosis not present

## 2022-11-19 ENCOUNTER — Encounter: Payer: Self-pay | Admitting: Urology

## 2022-11-19 ENCOUNTER — Ambulatory Visit (INDEPENDENT_AMBULATORY_CARE_PROVIDER_SITE_OTHER): Payer: Medicare PPO | Admitting: Urology

## 2022-11-19 VITALS — BP 125/73 | HR 50

## 2022-11-19 DIAGNOSIS — N5201 Erectile dysfunction due to arterial insufficiency: Secondary | ICD-10-CM

## 2022-11-19 DIAGNOSIS — H4423 Degenerative myopia, bilateral: Secondary | ICD-10-CM | POA: Diagnosis not present

## 2022-11-19 DIAGNOSIS — H401122 Primary open-angle glaucoma, left eye, moderate stage: Secondary | ICD-10-CM | POA: Diagnosis not present

## 2022-11-19 DIAGNOSIS — H3581 Retinal edema: Secondary | ICD-10-CM | POA: Diagnosis not present

## 2022-11-19 DIAGNOSIS — Z961 Presence of intraocular lens: Secondary | ICD-10-CM | POA: Diagnosis not present

## 2022-11-19 DIAGNOSIS — R972 Elevated prostate specific antigen [PSA]: Secondary | ICD-10-CM

## 2022-11-19 DIAGNOSIS — H469 Unspecified optic neuritis: Secondary | ICD-10-CM | POA: Diagnosis not present

## 2022-11-19 DIAGNOSIS — H401113 Primary open-angle glaucoma, right eye, severe stage: Secondary | ICD-10-CM | POA: Diagnosis not present

## 2022-11-19 NOTE — Progress Notes (Unsigned)
11/19/2022 11:53 AM   Edsel Petrin 10-09-1955 [id number]  Referring provider: Juliette Alcide, MD [address] [place],  [place] [address]  Chief Complaint  Patient presents with   trimix teaching    HPI: Mr Mcclurg is here for trimix teaching   PMH: Past Medical History:  Diagnosis Date   Arthritis    Chronic back pain    GERD (gastroesophageal reflux disease)    Glaucoma    History of kidney stones    history of    Surgical History: Past Surgical History:  Procedure Laterality Date   CATARACT EXTRACTION W/PHACO Right 06/01/2016   Procedure: CATARACT EXTRACTION PHACO AND INTRAOCULAR LENS PLACEMENT (IOC);  Surgeon: Jethro Bolus, MD;  Location: [facility];  Service: Ophthalmology;  Laterality: Right;  CDE:  4.34   CATARACT EXTRACTION W/PHACO Left 06/15/2016   Procedure: CATARACT EXTRACTION PHACO AND INTRAOCULAR LENS PLACEMENT (IOC);  Surgeon: Jethro Bolus, MD;  Location: [facility];  Service: Ophthalmology;  Laterality: Left;  CDE: 4.37   EYE SURGERY     ORCHIECTOMY Left    ruptured testicle   SHOULDER ARTHROSCOPY WITH ROTATOR CUFF REPAIR Right 02/14/2020   Procedure: Right Shoulder arthroscopy, subacromial decompression, distal clavicle resection rotator cuff repair;  Surgeon: Francena Hanly, MD;  Location: [facility];  Service: Orthopedics;  Laterality: Right;   TYMPANOPLASTY Right    with pathching    Home Medications:  Allergies as of 11/19/2022       Reactions   Netarsudil-latanoprost Other (See Comments)   Irritated eyes         Medication List        Accurate as of Nov 19, 2022 11:53 AM. If you have any questions, ask your nurse or doctor.          ALPRAZolam 1 MG tablet Commonly known as: XANAX Take 1 mg by mouth at bedtime.   AMBULATORY NON FORMULARY MEDICATION 0.2 mLs by Intracavernosal route as needed. Medication Name: Trimix  PGE Pap 30mg  Phent 1mg    cyclobenzaprine 10 MG tablet Commonly known as: FLEXERIL Take 1 tablet (10 mg total) by  mouth 3 (three) times daily as needed for muscle spasms.   cycloSPORINE 0.05 % ophthalmic emulsion Commonly known as: RESTASIS Place 1 drop into both eyes 2 (two) times daily.   Dexilant 60 MG capsule Generic drug: dexlansoprazole Take 60 mg by mouth daily before breakfast.   dorzolamide 2 % ophthalmic solution Commonly known as: TRUSOPT Place 1 drop into both eyes 2 (two) times daily.   DULoxetine 30 MG capsule Commonly known as: CYMBALTA Take 30 mg by mouth daily.   HYDROmorphone 2 MG tablet Commonly known as: Dilaudid Take 1-2 tablets (2-4 mg total) by mouth every 4 (four) hours as needed (prn post op pain not covered by your chronic oxycodone).   Linzess 72 MCG capsule Generic drug: linaclotide Take 72 mcg by mouth at bedtime.   meloxicam 15 MG tablet Commonly known as: MOBIC Take 1 tablet (15 mg total) by mouth daily.   ondansetron 4 MG tablet Commonly known as: Zofran Take 1 tablet (4 mg total) by mouth every 8 (eight) hours as needed for nausea or vomiting.   oxyCODONE-acetaminophen 10-325 MG tablet Commonly known as: PERCOCET Take 1 tablet by mouth 2 (two) times daily.   PARoxetine 20 MG tablet Commonly known as: PAXIL Take 20 mg by mouth at bedtime.   pregabalin 150 MG capsule Commonly known as: LYRICA Take 150 mg by mouth in the morning,  at noon, and at bedtime.   sildenafil 100 MG tablet Commonly known as: VIAGRA Take 1 tablet (100 mg total) by mouth daily as needed for erectile dysfunction.   terbinafine 250 MG tablet Commonly known as: LAMISIL Take 250 mg by mouth daily.   Travoprost (BAK Free) 0.004 % Soln ophthalmic solution Commonly known as: TRAVATAN Place 1 drop into both eyes at bedtime.   traZODone 100 MG tablet Commonly known as: DESYREL Take 100 mg by mouth at bedtime.   vardenafil 20 MG tablet Commonly known as: LEVITRA Take 1 tablet (20 mg total) by mouth daily as needed for erectile dysfunction.        Allergies:   Allergies  Allergen Reactions   Netarsudil-Latanoprost Other (See Comments)    Irritated eyes     Family History: Family History  Problem Relation Age of Onset   Diabetes Mother    Dementia Mother    Diabetes Father     Social History:  reports that he quit smoking about 54 years ago. His smoking use included cigarettes. He has a 5.00 pack-year smoking history. He has never used smokeless tobacco. He reports that he does not drink alcohol and does not use drugs.  ROS: All other review of systems were reviewed and are negative except what is noted above in HPI  Physical Exam: BP 125/73   Pulse (!) 50   Constitutional:  Alert and oriented, No acute distress. HEENT: [place] AT, moist mucus membranes.  Trachea midline, no masses. Cardiovascular: No clubbing, cyanosis, or edema. Respiratory: Normal respiratory effort, no increased work of breathing. GI: Abdomen is soft, nontender, nondistended, no abdominal masses GU: No CVA tenderness.  Lymph: No cervical or inguinal lymphadenopathy. Skin: No rashes, bruises or suspicious lesions. Neurologic: Grossly intact, no focal deficits, moving all 4 extremities. Psychiatric: Normal mood and affect.  Laboratory Data: Lab Results  Component Value Date   WBC 7.6 02/11/2020   HGB 14.8 02/11/2020   HCT 46.4 02/11/2020   MCV 89.6 02/11/2020   PLT 233 02/11/2020    Lab Results  Component Value Date   CREATININE 1.19 05/26/2016    No results found for: "PSA"  No results found for: "TESTOSTERONE"  No results found for: "HGBA1C"  Urinalysis    Component Value Date/Time   APPEARANCEUR Clear 10/15/2022 1201   GLUCOSEU Negative 10/15/2022 1201   BILIRUBINUR Negative 10/15/2022 1201   PROTEINUR Negative 10/15/2022 1201   NITRITE Negative 10/15/2022 1201   LEUKOCYTESUR Negative 10/15/2022 1201    Lab Results  Component Value Date   LABMICR Comment 10/15/2022   WBCUA None seen 03/25/2021   LABEPIT None seen 03/25/2021   BACTERIA  Few (A) 03/25/2021    Pertinent Imaging:  No results found for this or any previous visit.  No results found for this or any previous visit.  No results found for this or any previous visit.  No results found for this or any previous visit.  No results found for this or any previous visit.  No valid procedures specified. No results found for this or any previous visit.  No results found for this or any previous visit.  Trimix injection instruction:  Patient instructed to draw 0.40ml of trimix into the insulin syringe. I them instructed him to inject at the mid penile shaft at either the 3 or 9 o'clock position. I then injected the patient and he achieved a good erection in 20 minutes. Penile injection completed.   Assessment & Plan:    1.  Erectile dysfunction due to arterial insufficiency The patient was instructed on proper technique for trimix injection. He is instructed to alternate side and location of the injection. He was instructed in titrating the trimix dose. He is instructed to call the office for an erection lasting more than 4 hours    No follow-ups on file.  Wilkie Aye, MD  [facility]

## 2022-11-22 DIAGNOSIS — H401122 Primary open-angle glaucoma, left eye, moderate stage: Secondary | ICD-10-CM | POA: Diagnosis not present

## 2022-11-22 DIAGNOSIS — H401113 Primary open-angle glaucoma, right eye, severe stage: Secondary | ICD-10-CM | POA: Diagnosis not present

## 2022-11-23 ENCOUNTER — Encounter: Payer: Self-pay | Admitting: Urology

## 2022-11-23 NOTE — Patient Instructions (Signed)
Erectile Dysfunction ?Erectile dysfunction (ED) is the inability to get or keep an erection in order to have sexual intercourse. ED is considered a symptom of an underlying disorder and is not considered a disease. ED may include: ?Inability to get an erection. ?Lack of enough hardness of the erection to allow penetration. ?Loss of erection before sex is finished. ?What are the causes? ?This condition may be caused by: ?Physical causes, such as: ?Artery problems. This may include heart disease, high blood pressure, atherosclerosis, and diabetes. ?Hormonal problems, such as low testosterone. ?Obesity. ?Nerve problems. This may include back or pelvic injuries, multiple sclerosis, Parkinson's disease, spinal cord injury, and stroke. ?Certain medicines, such as: ?Pain relievers. ?Antidepressants. ?Blood pressure medicines and water pills (diuretics). ?Cancer medicines. ?Antihistamines. ?Muscle relaxants. ?Lifestyle factors, such as: ?Use of drugs such as marijuana, cocaine, or opioids. ?Excessive use of alcohol. ?Smoking. ?Lack of physical activity or exercise. ?Psychological causes, such as: ?Anxiety or stress. ?Sadness or depression. ?Exhaustion. ?Fear about sexual performance. ?Guilt. ?What are the signs or symptoms? ?Symptoms of this condition include: ?Inability to get an erection. ?Lack of enough hardness of the erection to allow penetration. ?Loss of the erection before sex is finished. ?Sometimes having normal erections, but with frequent unsatisfactory episodes. ?Low sexual satisfaction in either partner due to erection problems. ?A curved penis occurring with erection. The curve may cause pain, or the penis may be too curved to allow for intercourse. ?Never having nighttime or morning erections. ?How is this diagnosed? ?This condition is often diagnosed by: ?Performing a physical exam to find other diseases or specific problems with the penis. ?Asking you detailed questions about the problem. ?Doing tests,  such as: ?Blood tests to check for diabetes mellitus or high cholesterol, or to measure hormone levels. ?Other tests to check for underlying health conditions. ?An ultrasound exam to check for scarring. ?A test to check blood flow to the penis. ?Doing a sleep study at home to measure nighttime erections. ?How is this treated? ?This condition may be treated by: ?Medicines, such as: ?Medicine taken by mouth to help you achieve an erection (oral medicine). ?Hormone replacement therapy to replace low testosterone levels. ?Medicine that is injected into the penis. Your health care provider may instruct you how to give yourself these injections at home. ?Medicine that is delivered with a short applicator tube. The tube is inserted into the opening at the tip of the penis, which is the opening of the urethra. A tiny pellet of medicine is put in the urethra. The pellet dissolves and enhances erectile function. This is also called MUSE (medicated urethral system for erections) therapy. ?Vacuum pump. This is a pump with a ring on it. The pump and ring are placed on the penis and used to create pressure that helps the penis become erect. ?Penile implant surgery. In this procedure, you may receive: ?An inflatable implant. This consists of cylinders, a pump, and a reservoir. The cylinders can be inflated with a fluid that helps to create an erection, and they can be deflated after intercourse. ?A semi-rigid implant. This consists of two silicone rubber rods. The rods provide some rigidity. They are also flexible, so the penis can both curve downward in its normal position and become straight for sexual intercourse. ?Blood vessel surgery to improve blood flow to the penis. During this procedure, a blood vessel from a different part of the body is placed into the penis to allow blood to flow around (bypass) damaged or blocked blood vessels. ?Lifestyle changes,   such as exercising more, losing weight, and quitting smoking. ?Follow  these instructions at home: ?Medicines ? ?Take over-the-counter and prescription medicines only as told by your health care provider. Do not increase the dosage without first discussing it with your health care provider. ?If you are using self-injections, do injections as directed by your health care provider. Make sure you avoid any veins that are on the surface of the penis. After giving an injection, apply pressure to the injection site for 5 minutes. ?Talk to your health care provider about how to prevent headaches while taking ED medicines. These medicines may cause a sudden headache due to the increase in blood flow in your body. ?General instructions ?Exercise regularly, as directed by your health care provider. Work with your health care provider to lose weight, if needed. ?Do not use any products that contain nicotine or tobacco. These products include cigarettes, chewing tobacco, and vaping devices, such as e-cigarettes. If you need help quitting, ask your health care provider. ?Before using a vacuum pump, read the instructions that come with the pump and discuss any questions with your health care provider. ?Keep all follow-up visits. This is important. ?Contact a health care provider if: ?You feel nauseous. ?You are vomiting. ?You get sudden headaches while taking ED medicines. ?You have any concerns about your sexual health. ?Get help right away if: ?You are taking oral or injectable medicines and you have an erection that lasts longer than 4 hours. If your health care provider is unavailable, go to the nearest emergency room for evaluation. An erection that lasts much longer than 4 hours can result in permanent damage to your penis. ?You have severe pain in your groin or abdomen. ?You develop redness or severe swelling of your penis. ?You have redness spreading at your groin or lower abdomen. ?You are unable to urinate. ?You experience chest pain or a rapid heartbeat (palpitations) after taking oral  medicines. ?These symptoms may represent a serious problem that is an emergency. Do not wait to see if the symptoms will go away. Get medical help right away. Call your local emergency services (911 in the [place] Do not drive yourself to the hospital. ?Summary ?Erectile dysfunction (ED) is the inability to get or keep an erection during sexual intercourse. ?This condition is diagnosed based on a physical exam, your symptoms, and tests to determine the cause. Treatment varies depending on the cause and may include medicines, hormone therapy, surgery, or a vacuum pump. ?You may need follow-up visits to make sure that you are using your medicines or devices correctly. ?Get help right away if you are taking or injecting medicines and you have an erection that lasts longer than 4 hours. ?This information is not intended to replace advice given to you by your health care provider. Make sure you discuss any questions you have with your health care provider. ?Document Revised: 09/17/2020 Document Reviewed: 09/17/2020 ?Elsevier Patient Education ? 2023 [employer] ? ?

## 2022-12-14 DIAGNOSIS — Z79891 Long term (current) use of opiate analgesic: Secondary | ICD-10-CM | POA: Diagnosis not present

## 2022-12-14 DIAGNOSIS — G894 Chronic pain syndrome: Secondary | ICD-10-CM | POA: Diagnosis not present

## 2022-12-14 DIAGNOSIS — M5451 Vertebrogenic low back pain: Secondary | ICD-10-CM | POA: Diagnosis not present

## 2022-12-14 DIAGNOSIS — M25519 Pain in unspecified shoulder: Secondary | ICD-10-CM | POA: Diagnosis not present

## 2022-12-14 DIAGNOSIS — F419 Anxiety disorder, unspecified: Secondary | ICD-10-CM | POA: Diagnosis not present

## 2023-01-10 DIAGNOSIS — Z1329 Encounter for screening for other suspected endocrine disorder: Secondary | ICD-10-CM | POA: Diagnosis not present

## 2023-01-10 DIAGNOSIS — E7849 Other hyperlipidemia: Secondary | ICD-10-CM | POA: Diagnosis not present

## 2023-01-10 DIAGNOSIS — R972 Elevated prostate specific antigen [PSA]: Secondary | ICD-10-CM | POA: Diagnosis not present

## 2023-01-10 DIAGNOSIS — Z13220 Encounter for screening for lipoid disorders: Secondary | ICD-10-CM | POA: Diagnosis not present

## 2023-01-10 DIAGNOSIS — E119 Type 2 diabetes mellitus without complications: Secondary | ICD-10-CM | POA: Diagnosis not present

## 2023-01-10 DIAGNOSIS — Z0001 Encounter for general adult medical examination with abnormal findings: Secondary | ICD-10-CM | POA: Diagnosis not present

## 2023-01-11 DIAGNOSIS — F419 Anxiety disorder, unspecified: Secondary | ICD-10-CM | POA: Diagnosis not present

## 2023-01-11 DIAGNOSIS — M25519 Pain in unspecified shoulder: Secondary | ICD-10-CM | POA: Diagnosis not present

## 2023-01-11 DIAGNOSIS — G894 Chronic pain syndrome: Secondary | ICD-10-CM | POA: Diagnosis not present

## 2023-01-11 DIAGNOSIS — M5451 Vertebrogenic low back pain: Secondary | ICD-10-CM | POA: Diagnosis not present

## 2023-01-17 DIAGNOSIS — R001 Bradycardia, unspecified: Secondary | ICD-10-CM | POA: Diagnosis not present

## 2023-01-17 DIAGNOSIS — R030 Elevated blood-pressure reading, without diagnosis of hypertension: Secondary | ICD-10-CM | POA: Diagnosis not present

## 2023-01-17 DIAGNOSIS — E119 Type 2 diabetes mellitus without complications: Secondary | ICD-10-CM | POA: Diagnosis not present

## 2023-01-17 DIAGNOSIS — Z0001 Encounter for general adult medical examination with abnormal findings: Secondary | ICD-10-CM | POA: Diagnosis not present

## 2023-01-17 DIAGNOSIS — Z6831 Body mass index (BMI) 31.0-31.9, adult: Secondary | ICD-10-CM | POA: Diagnosis not present

## 2023-01-26 DIAGNOSIS — R42 Dizziness and giddiness: Secondary | ICD-10-CM | POA: Diagnosis not present

## 2023-01-26 DIAGNOSIS — R030 Elevated blood-pressure reading, without diagnosis of hypertension: Secondary | ICD-10-CM | POA: Diagnosis not present

## 2023-01-26 DIAGNOSIS — G894 Chronic pain syndrome: Secondary | ICD-10-CM | POA: Diagnosis not present

## 2023-01-26 DIAGNOSIS — M9979 Connective tissue and disc stenosis of intervertebral foramina of abdomen and other regions: Secondary | ICD-10-CM | POA: Diagnosis not present

## 2023-01-26 DIAGNOSIS — Z6831 Body mass index (BMI) 31.0-31.9, adult: Secondary | ICD-10-CM | POA: Diagnosis not present

## 2023-01-26 DIAGNOSIS — Z0000 Encounter for general adult medical examination without abnormal findings: Secondary | ICD-10-CM | POA: Diagnosis not present

## 2023-01-26 DIAGNOSIS — F411 Generalized anxiety disorder: Secondary | ICD-10-CM | POA: Diagnosis not present

## 2023-01-27 DIAGNOSIS — H3581 Retinal edema: Secondary | ICD-10-CM | POA: Diagnosis not present

## 2023-01-27 DIAGNOSIS — H401113 Primary open-angle glaucoma, right eye, severe stage: Secondary | ICD-10-CM | POA: Diagnosis not present

## 2023-01-27 DIAGNOSIS — H401122 Primary open-angle glaucoma, left eye, moderate stage: Secondary | ICD-10-CM | POA: Diagnosis not present

## 2023-02-08 DIAGNOSIS — Z79891 Long term (current) use of opiate analgesic: Secondary | ICD-10-CM | POA: Diagnosis not present

## 2023-02-08 DIAGNOSIS — F419 Anxiety disorder, unspecified: Secondary | ICD-10-CM | POA: Diagnosis not present

## 2023-02-08 DIAGNOSIS — G894 Chronic pain syndrome: Secondary | ICD-10-CM | POA: Diagnosis not present

## 2023-02-08 DIAGNOSIS — M5451 Vertebrogenic low back pain: Secondary | ICD-10-CM | POA: Diagnosis not present

## 2023-02-08 DIAGNOSIS — Z79899 Other long term (current) drug therapy: Secondary | ICD-10-CM | POA: Diagnosis not present

## 2023-02-08 DIAGNOSIS — M25519 Pain in unspecified shoulder: Secondary | ICD-10-CM | POA: Diagnosis not present

## 2023-03-31 DIAGNOSIS — H401113 Primary open-angle glaucoma, right eye, severe stage: Secondary | ICD-10-CM | POA: Diagnosis not present

## 2023-03-31 DIAGNOSIS — H401122 Primary open-angle glaucoma, left eye, moderate stage: Secondary | ICD-10-CM | POA: Diagnosis not present

## 2023-04-05 DIAGNOSIS — G894 Chronic pain syndrome: Secondary | ICD-10-CM | POA: Diagnosis not present

## 2023-04-05 DIAGNOSIS — F419 Anxiety disorder, unspecified: Secondary | ICD-10-CM | POA: Diagnosis not present

## 2023-04-05 DIAGNOSIS — Z79891 Long term (current) use of opiate analgesic: Secondary | ICD-10-CM | POA: Diagnosis not present

## 2023-04-05 DIAGNOSIS — M25519 Pain in unspecified shoulder: Secondary | ICD-10-CM | POA: Diagnosis not present

## 2023-04-05 DIAGNOSIS — M5451 Vertebrogenic low back pain: Secondary | ICD-10-CM | POA: Diagnosis not present

## 2023-04-07 DIAGNOSIS — H548 Legal blindness, as defined in USA: Secondary | ICD-10-CM | POA: Diagnosis not present

## 2023-04-07 DIAGNOSIS — H52202 Unspecified astigmatism, left eye: Secondary | ICD-10-CM | POA: Diagnosis not present

## 2023-04-07 DIAGNOSIS — H524 Presbyopia: Secondary | ICD-10-CM | POA: Diagnosis not present

## 2023-04-28 DIAGNOSIS — H401113 Primary open-angle glaucoma, right eye, severe stage: Secondary | ICD-10-CM | POA: Diagnosis not present

## 2023-04-28 DIAGNOSIS — H401122 Primary open-angle glaucoma, left eye, moderate stage: Secondary | ICD-10-CM | POA: Diagnosis not present

## 2023-05-17 ENCOUNTER — Other Ambulatory Visit: Payer: Medicare PPO

## 2023-05-25 ENCOUNTER — Ambulatory Visit: Payer: Medicare PPO | Admitting: Urology

## 2023-05-31 DIAGNOSIS — Z79891 Long term (current) use of opiate analgesic: Secondary | ICD-10-CM | POA: Diagnosis not present

## 2023-05-31 DIAGNOSIS — M25519 Pain in unspecified shoulder: Secondary | ICD-10-CM | POA: Diagnosis not present

## 2023-05-31 DIAGNOSIS — F419 Anxiety disorder, unspecified: Secondary | ICD-10-CM | POA: Diagnosis not present

## 2023-05-31 DIAGNOSIS — G894 Chronic pain syndrome: Secondary | ICD-10-CM | POA: Diagnosis not present

## 2023-05-31 DIAGNOSIS — M5451 Vertebrogenic low back pain: Secondary | ICD-10-CM | POA: Diagnosis not present

## 2023-08-04 DIAGNOSIS — Z6831 Body mass index (BMI) 31.0-31.9, adult: Secondary | ICD-10-CM | POA: Diagnosis not present

## 2023-08-04 DIAGNOSIS — R739 Hyperglycemia, unspecified: Secondary | ICD-10-CM | POA: Diagnosis not present

## 2023-08-04 DIAGNOSIS — F411 Generalized anxiety disorder: Secondary | ICD-10-CM | POA: Diagnosis not present

## 2023-08-09 DIAGNOSIS — F419 Anxiety disorder, unspecified: Secondary | ICD-10-CM | POA: Diagnosis not present

## 2023-08-09 DIAGNOSIS — M25519 Pain in unspecified shoulder: Secondary | ICD-10-CM | POA: Diagnosis not present

## 2023-08-09 DIAGNOSIS — G894 Chronic pain syndrome: Secondary | ICD-10-CM | POA: Diagnosis not present

## 2023-08-09 DIAGNOSIS — Z79891 Long term (current) use of opiate analgesic: Secondary | ICD-10-CM | POA: Diagnosis not present

## 2023-08-09 DIAGNOSIS — K5903 Drug induced constipation: Secondary | ICD-10-CM | POA: Diagnosis not present

## 2023-08-09 DIAGNOSIS — M5451 Vertebrogenic low back pain: Secondary | ICD-10-CM | POA: Diagnosis not present

## 2023-09-17 DIAGNOSIS — H401113 Primary open-angle glaucoma, right eye, severe stage: Secondary | ICD-10-CM | POA: Diagnosis not present

## 2023-09-17 DIAGNOSIS — H401122 Primary open-angle glaucoma, left eye, moderate stage: Secondary | ICD-10-CM | POA: Diagnosis not present

## 2023-09-19 IMAGING — DX DG KNEE COMPLETE 4+V*R*
4 series · 4 of 4 positions shown · non-contrast
Comparison: None.

CLINICAL DATA: Pain

EXAM:
RIGHT KNEE - COMPLETE 4 VIEW

[knee ap]
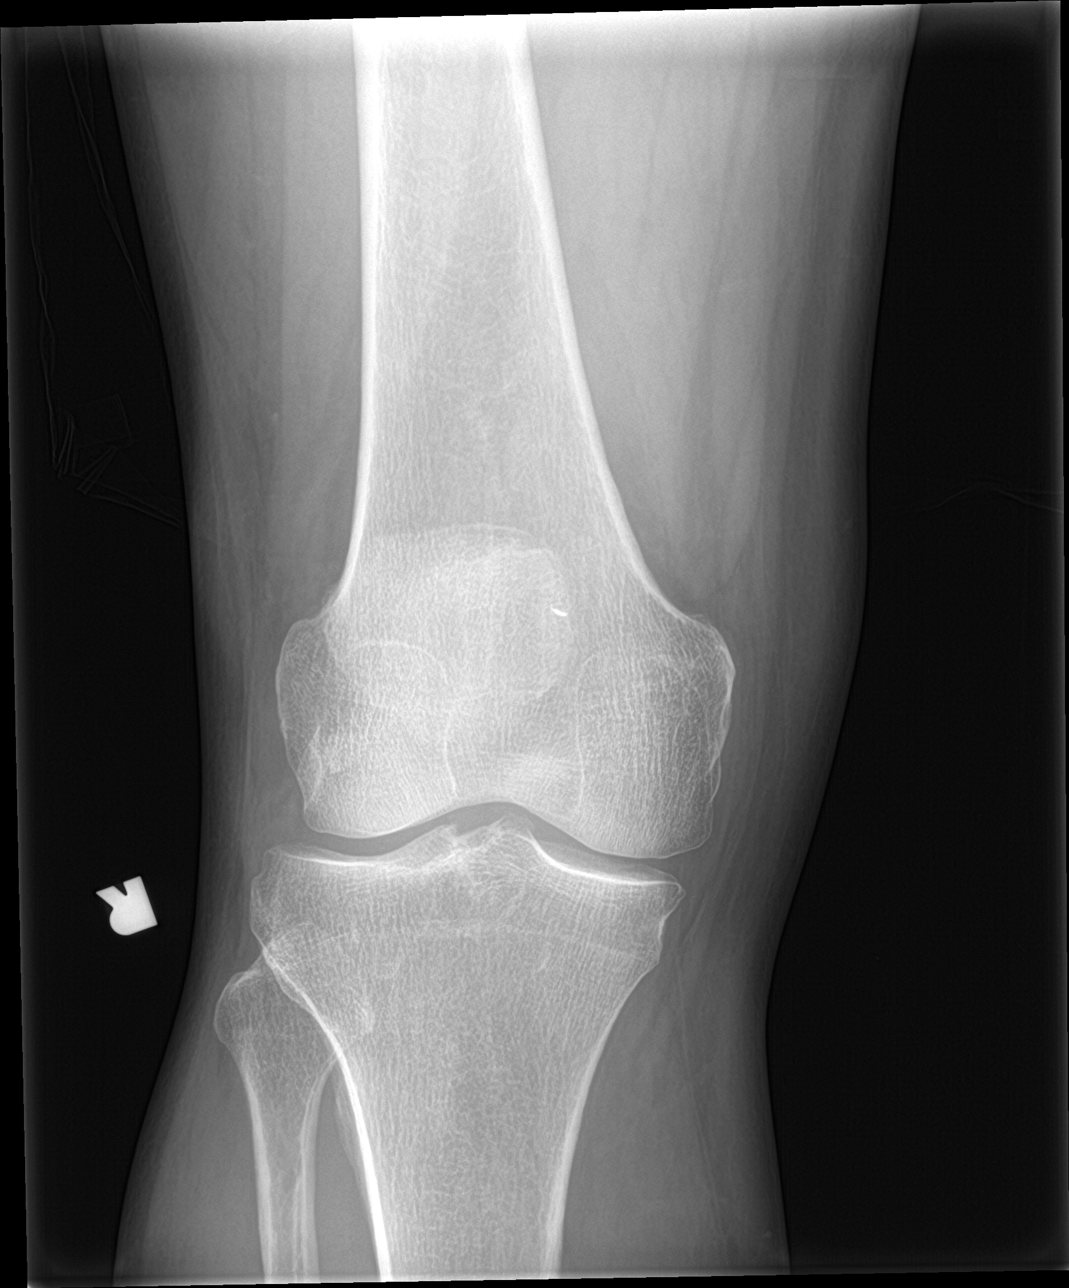

[knee obl (1 of 2)]
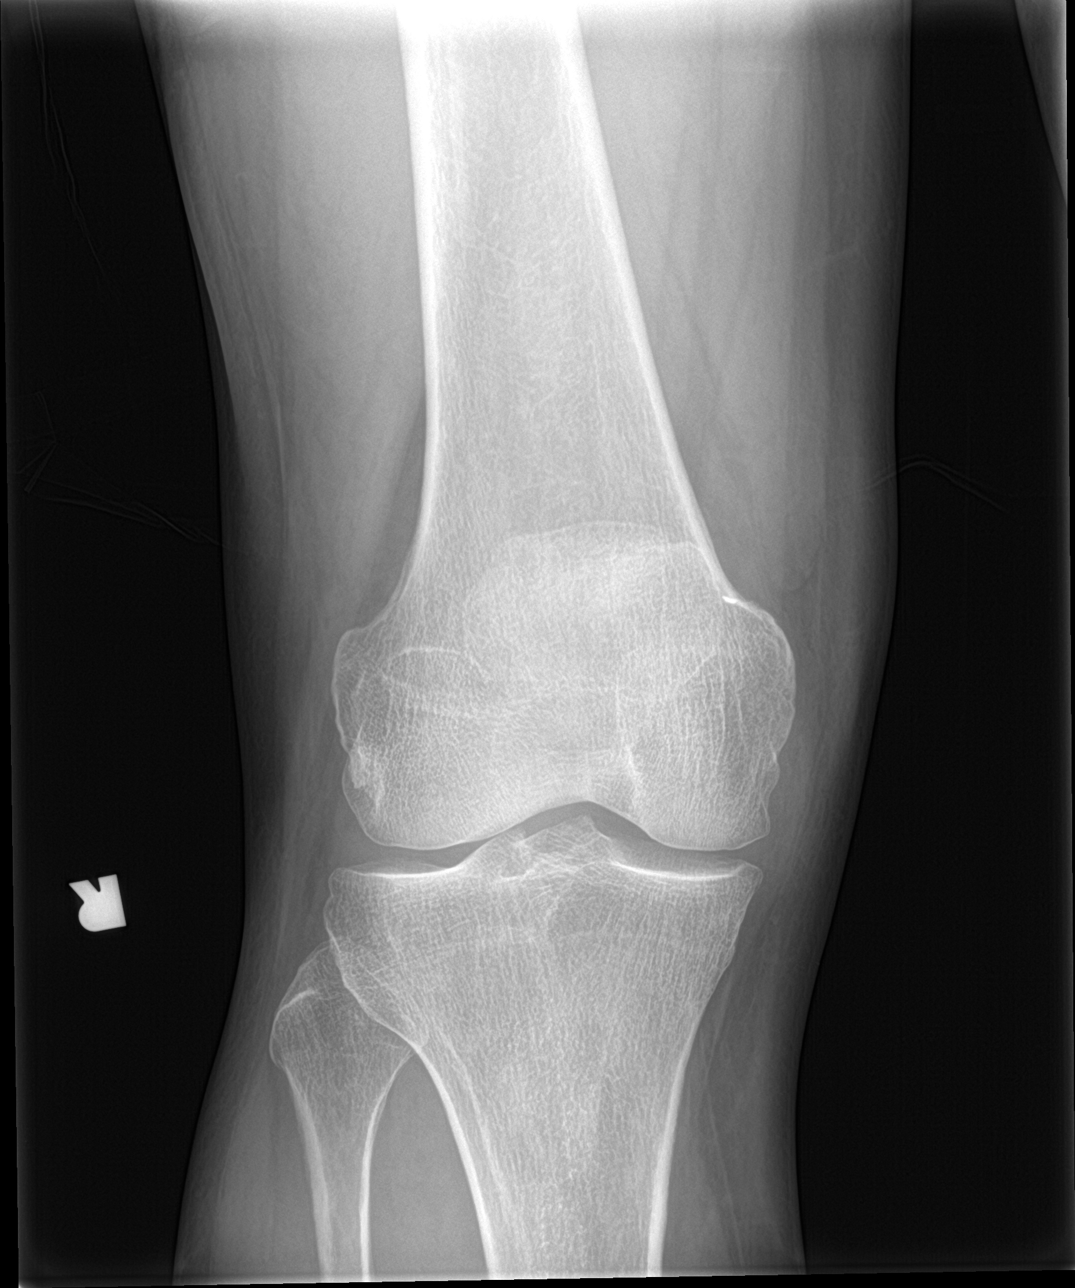

[knee obl (2 of 2)]
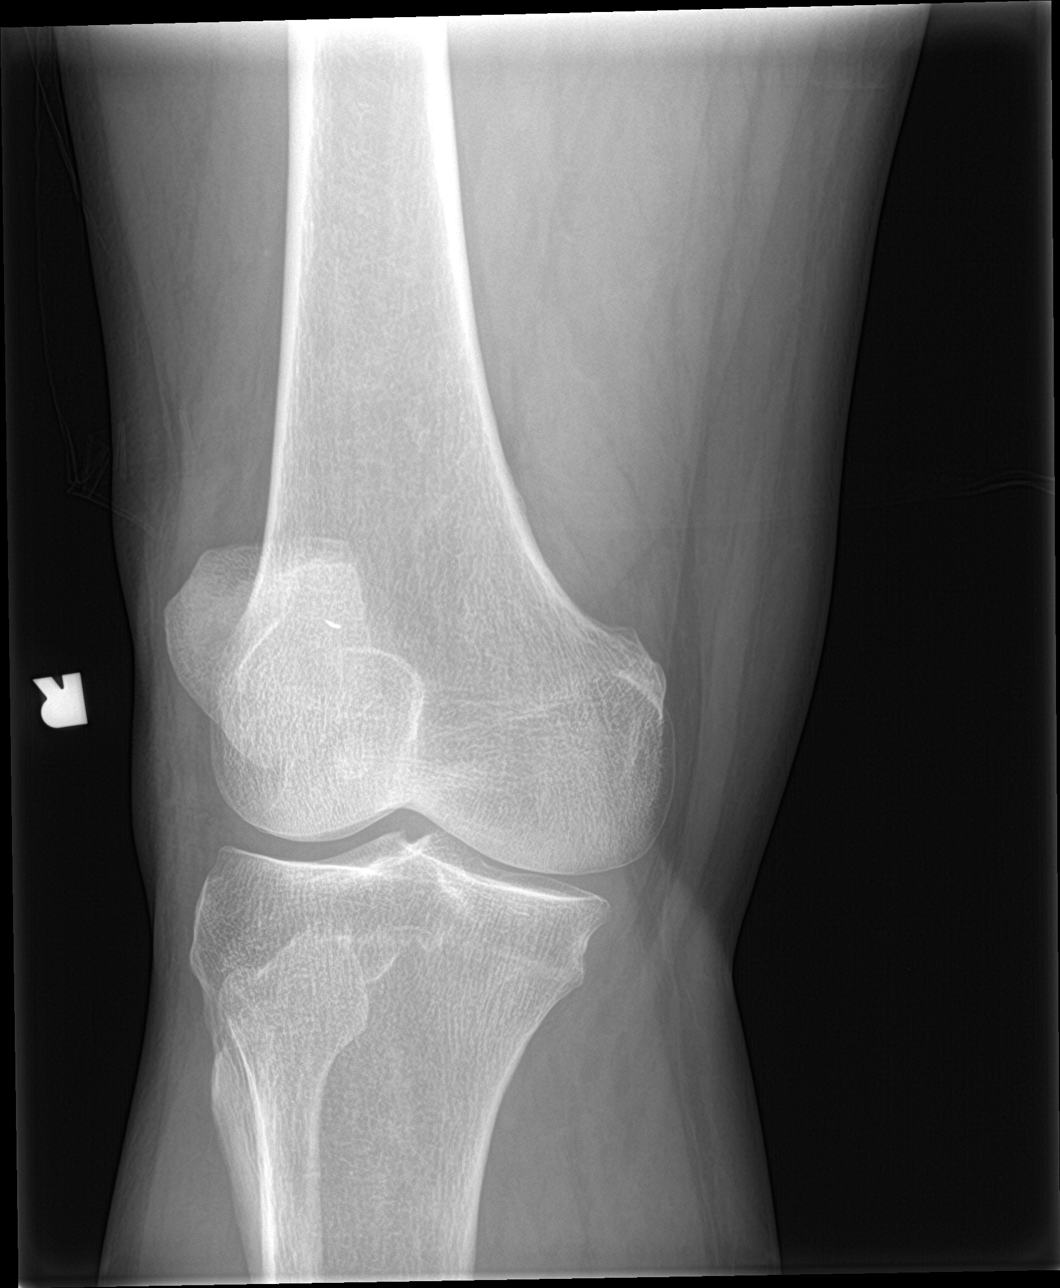

[knee lat]
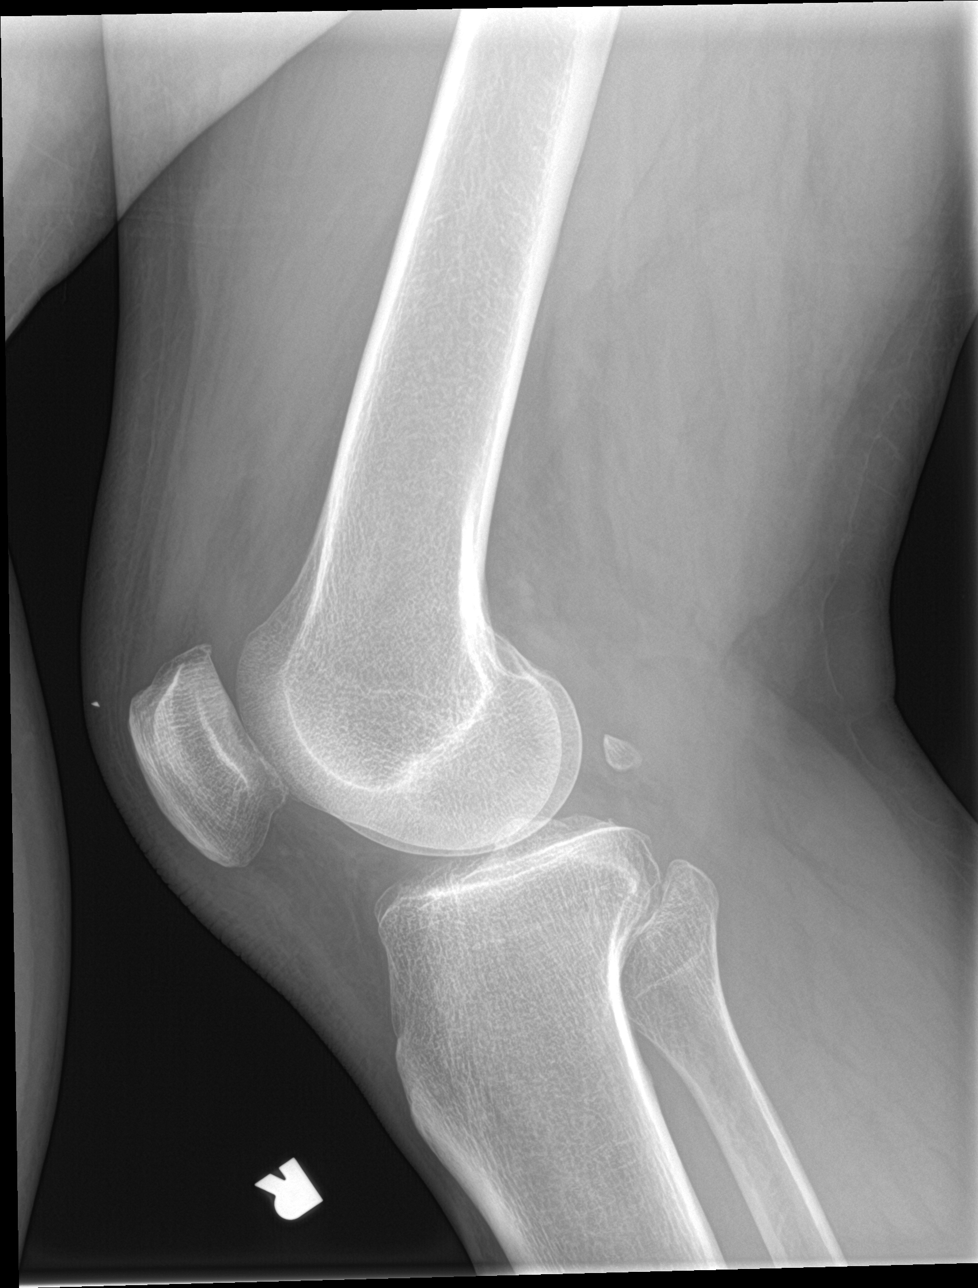

[4 of 4 positions shown; findings below may reference images not displayed]

FINDINGS: No recent fracture or dislocation is seen. There is possible small
effusion in the suprapatellar bursa. There is a 3 mm linear metallic
density in the subcutaneous plane anterior to the patella, possibly
foreign body. Small bony spurs seen in patella.
IMPRESSION: No recent fracture or dislocation is seen. Possible small effusion
is present in suprapatellar bursa. There is 3 mm linear metallic
density in the subcutaneous plane anterior to the upper aspect of
the patella suggesting foreign body. Small bony spurs seen in the
patella.

## 2023-10-04 DIAGNOSIS — M25519 Pain in unspecified shoulder: Secondary | ICD-10-CM | POA: Diagnosis not present

## 2023-10-04 DIAGNOSIS — K5903 Drug induced constipation: Secondary | ICD-10-CM | POA: Diagnosis not present

## 2023-10-04 DIAGNOSIS — M5451 Vertebrogenic low back pain: Secondary | ICD-10-CM | POA: Diagnosis not present

## 2023-10-04 DIAGNOSIS — G894 Chronic pain syndrome: Secondary | ICD-10-CM | POA: Diagnosis not present

## 2023-10-04 DIAGNOSIS — Z79891 Long term (current) use of opiate analgesic: Secondary | ICD-10-CM | POA: Diagnosis not present

## 2023-10-04 DIAGNOSIS — F419 Anxiety disorder, unspecified: Secondary | ICD-10-CM | POA: Diagnosis not present

## 2023-11-09 DIAGNOSIS — D489 Neoplasm of uncertain behavior, unspecified: Secondary | ICD-10-CM | POA: Diagnosis not present

## 2023-11-09 DIAGNOSIS — Z6831 Body mass index (BMI) 31.0-31.9, adult: Secondary | ICD-10-CM | POA: Diagnosis not present

## 2023-11-09 DIAGNOSIS — F411 Generalized anxiety disorder: Secondary | ICD-10-CM | POA: Diagnosis not present

## 2023-11-09 DIAGNOSIS — F329 Major depressive disorder, single episode, unspecified: Secondary | ICD-10-CM | POA: Diagnosis not present

## 2023-12-06 DIAGNOSIS — K5903 Drug induced constipation: Secondary | ICD-10-CM | POA: Diagnosis not present

## 2023-12-06 DIAGNOSIS — M25519 Pain in unspecified shoulder: Secondary | ICD-10-CM | POA: Diagnosis not present

## 2023-12-06 DIAGNOSIS — M5451 Vertebrogenic low back pain: Secondary | ICD-10-CM | POA: Diagnosis not present

## 2023-12-06 DIAGNOSIS — G894 Chronic pain syndrome: Secondary | ICD-10-CM | POA: Diagnosis not present

## 2023-12-06 DIAGNOSIS — F419 Anxiety disorder, unspecified: Secondary | ICD-10-CM | POA: Diagnosis not present

## 2023-12-06 DIAGNOSIS — Z79891 Long term (current) use of opiate analgesic: Secondary | ICD-10-CM | POA: Diagnosis not present

## 2023-12-19 DIAGNOSIS — H401113 Primary open-angle glaucoma, right eye, severe stage: Secondary | ICD-10-CM | POA: Diagnosis not present

## 2023-12-19 DIAGNOSIS — H401122 Primary open-angle glaucoma, left eye, moderate stage: Secondary | ICD-10-CM | POA: Diagnosis not present

## 2024-01-16 DIAGNOSIS — L720 Epidermal cyst: Secondary | ICD-10-CM | POA: Diagnosis not present

## 2024-01-16 DIAGNOSIS — L820 Inflamed seborrheic keratosis: Secondary | ICD-10-CM | POA: Diagnosis not present

## 2024-01-16 DIAGNOSIS — L918 Other hypertrophic disorders of the skin: Secondary | ICD-10-CM | POA: Diagnosis not present

## 2024-01-24 DIAGNOSIS — Z1329 Encounter for screening for other suspected endocrine disorder: Secondary | ICD-10-CM | POA: Diagnosis not present

## 2024-01-24 DIAGNOSIS — Z13220 Encounter for screening for lipoid disorders: Secondary | ICD-10-CM | POA: Diagnosis not present

## 2024-01-24 DIAGNOSIS — R42 Dizziness and giddiness: Secondary | ICD-10-CM | POA: Diagnosis not present

## 2024-01-24 DIAGNOSIS — Z125 Encounter for screening for malignant neoplasm of prostate: Secondary | ICD-10-CM | POA: Diagnosis not present

## 2024-01-24 DIAGNOSIS — E119 Type 2 diabetes mellitus without complications: Secondary | ICD-10-CM | POA: Diagnosis not present

## 2024-01-24 DIAGNOSIS — R351 Nocturia: Secondary | ICD-10-CM | POA: Diagnosis not present

## 2024-01-24 DIAGNOSIS — Z0001 Encounter for general adult medical examination with abnormal findings: Secondary | ICD-10-CM | POA: Diagnosis not present

## 2024-01-24 DIAGNOSIS — E1165 Type 2 diabetes mellitus with hyperglycemia: Secondary | ICD-10-CM | POA: Diagnosis not present

## 2024-01-24 DIAGNOSIS — R739 Hyperglycemia, unspecified: Secondary | ICD-10-CM | POA: Diagnosis not present

## 2024-01-31 DIAGNOSIS — M5451 Vertebrogenic low back pain: Secondary | ICD-10-CM | POA: Diagnosis not present

## 2024-01-31 DIAGNOSIS — F419 Anxiety disorder, unspecified: Secondary | ICD-10-CM | POA: Diagnosis not present

## 2024-01-31 DIAGNOSIS — Z79891 Long term (current) use of opiate analgesic: Secondary | ICD-10-CM | POA: Diagnosis not present

## 2024-01-31 DIAGNOSIS — K5903 Drug induced constipation: Secondary | ICD-10-CM | POA: Diagnosis not present

## 2024-01-31 DIAGNOSIS — M25519 Pain in unspecified shoulder: Secondary | ICD-10-CM | POA: Diagnosis not present

## 2024-01-31 DIAGNOSIS — G894 Chronic pain syndrome: Secondary | ICD-10-CM | POA: Diagnosis not present

## 2024-02-01 DIAGNOSIS — Z0000 Encounter for general adult medical examination without abnormal findings: Secondary | ICD-10-CM | POA: Diagnosis not present

## 2024-02-01 DIAGNOSIS — F411 Generalized anxiety disorder: Secondary | ICD-10-CM | POA: Diagnosis not present

## 2024-02-01 DIAGNOSIS — J309 Allergic rhinitis, unspecified: Secondary | ICD-10-CM | POA: Diagnosis not present

## 2024-02-01 DIAGNOSIS — E1165 Type 2 diabetes mellitus with hyperglycemia: Secondary | ICD-10-CM | POA: Diagnosis not present

## 2024-02-01 DIAGNOSIS — Z1331 Encounter for screening for depression: Secondary | ICD-10-CM | POA: Diagnosis not present

## 2024-02-01 DIAGNOSIS — Z1389 Encounter for screening for other disorder: Secondary | ICD-10-CM | POA: Diagnosis not present

## 2024-02-01 DIAGNOSIS — R001 Bradycardia, unspecified: Secondary | ICD-10-CM | POA: Diagnosis not present

## 2024-02-01 DIAGNOSIS — H6990 Unspecified Eustachian tube disorder, unspecified ear: Secondary | ICD-10-CM | POA: Diagnosis not present

## 2024-02-01 DIAGNOSIS — Z0001 Encounter for general adult medical examination with abnormal findings: Secondary | ICD-10-CM | POA: Diagnosis not present

## 2024-03-01 DIAGNOSIS — H401122 Primary open-angle glaucoma, left eye, moderate stage: Secondary | ICD-10-CM | POA: Diagnosis not present

## 2024-03-01 DIAGNOSIS — H401113 Primary open-angle glaucoma, right eye, severe stage: Secondary | ICD-10-CM | POA: Diagnosis not present

## 2024-03-07 DIAGNOSIS — Z6830 Body mass index (BMI) 30.0-30.9, adult: Secondary | ICD-10-CM | POA: Diagnosis not present

## 2024-03-07 DIAGNOSIS — F411 Generalized anxiety disorder: Secondary | ICD-10-CM | POA: Diagnosis not present

## 2024-03-07 DIAGNOSIS — E119 Type 2 diabetes mellitus without complications: Secondary | ICD-10-CM | POA: Diagnosis not present

## 2024-03-27 DIAGNOSIS — G894 Chronic pain syndrome: Secondary | ICD-10-CM | POA: Diagnosis not present

## 2024-03-27 DIAGNOSIS — M5451 Vertebrogenic low back pain: Secondary | ICD-10-CM | POA: Diagnosis not present

## 2024-03-27 DIAGNOSIS — M25519 Pain in unspecified shoulder: Secondary | ICD-10-CM | POA: Diagnosis not present

## 2024-03-27 DIAGNOSIS — Z79891 Long term (current) use of opiate analgesic: Secondary | ICD-10-CM | POA: Diagnosis not present

## 2024-04-09 DIAGNOSIS — G894 Chronic pain syndrome: Secondary | ICD-10-CM | POA: Diagnosis not present

## 2024-04-09 DIAGNOSIS — Z6829 Body mass index (BMI) 29.0-29.9, adult: Secondary | ICD-10-CM | POA: Diagnosis not present

## 2024-04-09 DIAGNOSIS — E119 Type 2 diabetes mellitus without complications: Secondary | ICD-10-CM | POA: Diagnosis not present

## 2024-04-09 DIAGNOSIS — R001 Bradycardia, unspecified: Secondary | ICD-10-CM | POA: Diagnosis not present

## 2024-04-09 DIAGNOSIS — F330 Major depressive disorder, recurrent, mild: Secondary | ICD-10-CM | POA: Diagnosis not present

## 2024-05-07 ENCOUNTER — Encounter: Payer: Self-pay | Admitting: Cardiology

## 2024-05-07 ENCOUNTER — Ambulatory Visit: Attending: Cardiology | Admitting: Cardiology

## 2024-05-07 VITALS — BP 125/72 | HR 48 | Ht 70.0 in | Wt 208.0 lb

## 2024-05-07 DIAGNOSIS — R001 Bradycardia, unspecified: Secondary | ICD-10-CM

## 2024-05-07 NOTE — Progress Notes (Signed)
 Clinical Summary Mr. Bures is a 68 y.o.male last seen in 07/2019 in our clinic, seen today as a new patient for the following medical problems  1. Bradycardia - prior evaluation in 2021 2021 monitor Min HR 39, Max HR 127, Avg HR 63. Min HR occurred in early AM hours presumably while sleeping EKG today shows sinus brady 48 today -01/2024 normal TSH - no syncope or presyncope. Has some equilibrium issued with walking related to vision loss from glaucoma.      2.Vision loss - has glaucoma, dealing vision loss.   Past Medical History:  Diagnosis Date   Arthritis    Chronic back pain    GERD (gastroesophageal reflux disease)    Glaucoma    History of kidney stones    history of     Allergies  Allergen Reactions   Netarsudil-Latanoprost Other (See Comments)    Irritated eyes      Current Outpatient Medications  Medication Sig Dispense Refill   ALPRAZolam (XANAX) 1 MG tablet Take 1 mg by mouth at bedtime.      AMBULATORY NON FORMULARY MEDICATION 0.2 mLs by Intracavernosal route as needed. Medication Name: Trimix  PGE 30mcg Pap 30mg  Phent 1mg  5 mL 5   cyclobenzaprine  (FLEXERIL ) 10 MG tablet Take 1 tablet (10 mg total) by mouth 3 (three) times daily as needed for muscle spasms. 30 tablet 1   cycloSPORINE (RESTASIS) 0.05 % ophthalmic emulsion Place 1 drop into both eyes 2 (two) times daily.     DEXILANT 60 MG capsule Take 60 mg by mouth daily before breakfast.      dorzolamide (TRUSOPT) 2 % ophthalmic solution Place 1 drop into both eyes 2 (two) times daily.     DULoxetine (CYMBALTA) 30 MG capsule Take 30 mg by mouth daily.     HYDROmorphone  (DILAUDID ) 2 MG tablet Take 1-2 tablets (2-4 mg total) by mouth every 4 (four) hours as needed (prn post op pain not covered by your chronic oxycodone). 30 tablet 0   LINZESS 72 MCG capsule Take 72 mcg by mouth at bedtime.      meloxicam  (MOBIC ) 15 MG tablet Take 1 tablet (15 mg total) by mouth daily. 30 tablet 1   ondansetron   (ZOFRAN ) 4 MG tablet Take 1 tablet (4 mg total) by mouth every 8 (eight) hours as needed for nausea or vomiting. 10 tablet 0   oxyCODONE-acetaminophen  (PERCOCET) 10-325 MG tablet Take 1 tablet by mouth 2 (two) times daily.     PARoxetine (PAXIL) 20 MG tablet Take 20 mg by mouth at bedtime.      pregabalin (LYRICA) 150 MG capsule Take 150 mg by mouth in the morning, at noon, and at bedtime.      sildenafil  (VIAGRA ) 100 MG tablet Take 1 tablet (100 mg total) by mouth daily as needed for erectile dysfunction. 10 tablet 6   terbinafine (LAMISIL) 250 MG tablet Take 250 mg by mouth daily.     Travoprost, BAK Free, (TRAVATAN) 0.004 % SOLN ophthalmic solution Place 1 drop into both eyes at bedtime.     traZODone (DESYREL) 100 MG tablet Take 100 mg by mouth at bedtime.      vardenafil  (LEVITRA ) 20 MG tablet Take 1 tablet (20 mg total) by mouth daily as needed for erectile dysfunction. 6 tablet 0   No current facility-administered medications for this visit.     Past Surgical History:  Procedure Laterality Date   CATARACT EXTRACTION W/PHACO Right 06/01/2016   Procedure:  CATARACT EXTRACTION PHACO AND INTRAOCULAR LENS PLACEMENT (IOC);  Surgeon: Oneil Platts, MD;  Location: [facility];  Service: Ophthalmology;  Laterality: Right;  CDE:  4.34   CATARACT EXTRACTION W/PHACO Left 06/15/2016   Procedure: CATARACT EXTRACTION PHACO AND INTRAOCULAR LENS PLACEMENT (IOC);  Surgeon: Oneil Platts, MD;  Location: [facility];  Service: Ophthalmology;  Laterality: Left;  CDE: 4.37   EYE SURGERY     ORCHIECTOMY Left    ruptured testicle   SHOULDER ARTHROSCOPY WITH ROTATOR CUFF REPAIR Right 02/14/2020   Procedure: Right Shoulder arthroscopy, subacromial decompression, distal clavicle resection rotator cuff repair;  Surgeon: Melita Drivers, MD;  Location: [facility];  Service: Orthopedics;  Laterality: Right;   TYMPANOPLASTY Right    with pathching     Allergies  Allergen Reactions   Netarsudil-Latanoprost Other (See Comments)     Irritated eyes       Family History  Problem Relation Age of Onset   Diabetes Mother    Dementia Mother    Diabetes Father      Social History Mr. Oakley reports that he quit smoking about 55 years ago. His smoking use included cigarettes. He started smoking about 60 years ago. He has a 5 pack-year smoking history. He has never used smokeless tobacco. Mr. Grzywacz reports no history of alcohol  use.     Physical Examination Today's Vitals   05/07/24 1525  BP: 125/72  Pulse: (!) 48  SpO2: 95%  Weight: 208 lb (94.3 kg)  Height: 5' 10 (1.778 m)   Body mass index is 29.84 kg/m.  Gen: resting comfortably, no acute distress HEENT: no scleral icterus, pupils equal round and reactive, no palptable cervical adenopathy,  CV: brady, regular, no mrg, no jvd Resp: Clear to auscultation bilaterally GI: abdomen is soft, non-tender, non-distended, normal bowel sounds, no hepatosplenomegaly MSK: extremities are warm, no edema.  Skin: warm, no rash Neuro:  no focal deficits Psych: appropriate affect   Diagnostic Studies 08/2019 holter 24 hour holter monitor Min HR 39, Max HR 127, Avg HR 63. Min HR occurred in early AM hours presumably while sleeping Rare supraventricular ectopy primarily as isoalted PACs. Rare short runs of atach up to 6 beats Rare ventricular ectopy, all in the form of isolated PVCs No symptoms reported    Assessment and Plan   1. Bradycardia - chronic bradycardia - 2021 monitor sinus brady, 39 to 127 with average 63 - EKG today sinus brady 48 - he denies any specific symptoms - at this time would just monitor, if were to develop significant symptoms would repeat home monitor - avoid av nodal agents   F/u 6 months     Dorn PHEBE Ross, M.D.

## 2024-05-07 NOTE — Patient Instructions (Signed)

## 2024-05-22 DIAGNOSIS — Z79891 Long term (current) use of opiate analgesic: Secondary | ICD-10-CM | POA: Diagnosis not present

## 2024-05-22 DIAGNOSIS — F419 Anxiety disorder, unspecified: Secondary | ICD-10-CM | POA: Diagnosis not present

## 2024-05-22 DIAGNOSIS — M5451 Vertebrogenic low back pain: Secondary | ICD-10-CM | POA: Diagnosis not present

## 2024-05-22 DIAGNOSIS — M25519 Pain in unspecified shoulder: Secondary | ICD-10-CM | POA: Diagnosis not present

## 2024-05-22 DIAGNOSIS — G894 Chronic pain syndrome: Secondary | ICD-10-CM | POA: Diagnosis not present

## 2024-05-22 DIAGNOSIS — K5903 Drug induced constipation: Secondary | ICD-10-CM | POA: Diagnosis not present

## 2024-05-28 ENCOUNTER — Ambulatory Visit: Admitting: Urology

## 2024-05-28 DIAGNOSIS — N481 Balanitis: Secondary | ICD-10-CM

## 2024-05-28 DIAGNOSIS — R300 Dysuria: Secondary | ICD-10-CM

## 2024-05-28 DIAGNOSIS — N5201 Erectile dysfunction due to arterial insufficiency: Secondary | ICD-10-CM | POA: Diagnosis not present

## 2024-05-28 DIAGNOSIS — R972 Elevated prostate specific antigen [PSA]: Secondary | ICD-10-CM

## 2024-05-28 MED ORDER — AMBULATORY NON FORMULARY MEDICATION: 0.2000 mL | 5 refills | Status: DC | PRN

## 2024-05-28 MED ORDER — CLOTRIMAZOLE-BETAMETHASONE 1-0.05 % EX CREA: 1.0000 | TOPICAL_CREAM | Freq: Two times a day (BID) | CUTANEOUS | 5 refills | Status: AC

## 2024-05-28 NOTE — Progress Notes (Unsigned)
 05/28/2024 3:24 PM   Steven Richard April 18, 1956 [id number]  Referring provider: Lari Elspeth BRAVO, MD [address] [place],  [place] [address]  Followup erectile dysfunction   HPI: Mr [name] is a 68yo here for followup for erectile dysfunction and penile irritation. IPSS 9 QOL 2 on no BPH therapy. For the past month he has noted worsening penile irritation. He was started on farxiga 1 month.    PMH: Past Medical History:  Diagnosis Date   Arthritis    Chronic back pain    GERD (gastroesophageal reflux disease)    Glaucoma    History of kidney stones    history of    Surgical History: Past Surgical History:  Procedure Laterality Date   CATARACT EXTRACTION W/PHACO Right 06/01/2016   Procedure: CATARACT EXTRACTION PHACO AND INTRAOCULAR LENS PLACEMENT (IOC);  Surgeon: Steven Platts, MD;  Location: [facility];  Service: Ophthalmology;  Laterality: Right;  CDE:  4.34   CATARACT EXTRACTION W/PHACO Left 06/15/2016   Procedure: CATARACT EXTRACTION PHACO AND INTRAOCULAR LENS PLACEMENT (IOC);  Surgeon: Steven Platts, MD;  Location: [facility];  Service: Ophthalmology;  Laterality: Left;  CDE: 4.37   EYE SURGERY     ORCHIECTOMY Left    ruptured testicle   SHOULDER ARTHROSCOPY WITH ROTATOR CUFF REPAIR Right 02/14/2020   Procedure: Right Shoulder arthroscopy, subacromial decompression, distal clavicle resection rotator cuff repair;  Surgeon: Steven Drivers, MD;  Location: [facility];  Service: Orthopedics;  Laterality: Right;   TYMPANOPLASTY Right    with pathching    Home Medications:  Allergies as of 05/28/2024       Reactions   Netarsudil-latanoprost Other (See Comments)   Irritated eyes         Medication List        Accurate as of May 28, 2024  3:24 PM. If you have any questions, ask your nurse or doctor.          ALPRAZolam 1 MG tablet Commonly known as: XANAX Take 1 mg by mouth at bedtime.   AMBULATORY NON FORMULARY MEDICATION 0.2 mLs by Intracavernosal route as needed.  Medication Name: Trimix  PGE 30mcg Pap 30mg  Phent 1mg    cyclobenzaprine  10 MG tablet Commonly known as: FLEXERIL  Take 1 tablet (10 mg total) by mouth 3 (three) times daily as needed for muscle spasms.   cycloSPORINE 0.05 % ophthalmic emulsion Commonly known as: RESTASIS Place 1 drop into both eyes 2 (two) times daily.   Dexilant 60 MG capsule Generic drug: dexlansoprazole Take 60 mg by mouth daily before breakfast.   dorzolamide 2 % ophthalmic solution Commonly known as: TRUSOPT Place 1 drop into both eyes 2 (two) times daily.   Farxiga 10 MG Tabs tablet Generic drug: dapagliflozin propanediol Take 10 mg by mouth daily.   HYDROmorphone  2 MG tablet Commonly known as: Dilaudid  Take 1-2 tablets (2-4 mg total) by mouth every 4 (four) hours as needed (prn post op pain not covered by your chronic oxycodone).   Linzess 72 MCG capsule Generic drug: linaclotide Take 72 mcg by mouth at bedtime.   ondansetron  4 MG tablet Commonly known as: Zofran  Take 1 tablet (4 mg total) by mouth every 8 (eight) hours as needed for nausea or vomiting.   oxyCODONE-acetaminophen  10-325 MG tablet Commonly known as: PERCOCET Take 1 tablet by mouth 2 (two) times daily.   PARoxetine 20 MG tablet Commonly known as: PAXIL Take 20 mg by mouth at bedtime.   pregabalin 150 MG capsule Commonly known as: LYRICA  Take 150 mg by mouth in the morning, at noon, and at bedtime.   sildenafil  100 MG tablet Commonly known as: VIAGRA  Take 1 tablet (100 mg total) by mouth daily as needed for erectile dysfunction.   Travoprost (BAK Free) 0.004 % Soln ophthalmic solution Commonly known as: TRAVATAN Place 1 drop into both eyes at bedtime.   traZODone 100 MG tablet Commonly known as: DESYREL Take 100 mg by mouth at bedtime.        Allergies:  Allergies  Allergen Reactions   Netarsudil-Latanoprost Other (See Comments)    Irritated eyes     Family History: Family History  Problem Relation Age of  Onset   Diabetes Mother    Dementia Mother    Diabetes Father     Social History:  reports that he quit smoking about 56 years ago. His smoking use included cigarettes. He started smoking about 61 years ago. He has a 5 pack-year smoking history. He has never used smokeless tobacco. He reports current drug use. Drug: Marijuana. He reports that he does not drink alcohol .  ROS: All other review of systems were reviewed and are negative except what is noted above in HPI  Physical Exam: There were no vitals taken for this visit.  Constitutional:  Alert and oriented, No acute distress. HEENT: [place] AT, moist mucus membranes.  Trachea midline, no masses. Cardiovascular: No clubbing, cyanosis, or edema. Respiratory: Normal respiratory effort, no increased work of breathing. GI: Abdomen is soft, nontender, nondistended, no abdominal masses GU: No CVA tenderness.  Lymph: No cervical or inguinal lymphadenopathy. Skin: No rashes, bruises or suspicious lesions. Neurologic: Grossly intact, no focal deficits, moving all 4 extremities. Psychiatric: Normal mood and affect.  Laboratory Data: Lab Results  Component Value Date   WBC 7.6 02/11/2020   HGB 14.8 02/11/2020   HCT 46.4 02/11/2020   MCV 89.6 02/11/2020   PLT 233 02/11/2020    Lab Results  Component Value Date   CREATININE 1.19 05/26/2016    No results found for: PSA  No results found for: TESTOSTERONE   No results found for: HGBA1C  Urinalysis    Component Value Date/Time   APPEARANCEUR Clear 10/15/2022 1201   GLUCOSEU Negative 10/15/2022 1201   BILIRUBINUR Negative 10/15/2022 1201   PROTEINUR Negative 10/15/2022 1201   NITRITE Negative 10/15/2022 1201   LEUKOCYTESUR Negative 10/15/2022 1201    Lab Results  Component Value Date   LABMICR Comment 10/15/2022   WBCUA None seen 03/25/2021   LABEPIT None seen 03/25/2021   BACTERIA Few (A) 03/25/2021    Pertinent Imaging: *** No results found for this or any  previous visit.  No results found for this or any previous visit.  No results found for this or any previous visit.  No results found for this or any previous visit.  No results found for this or any previous visit.  No results found for this or any previous visit.  No results found for this or any previous visit.  No results found for this or any previous visit.   Assessment & Plan:    1. Erectile dysfunction due to arterial insufficiency (Primary) Increase prostaglandin to 50mcg  2. Balanitis -clotrimazole  BID for 21 days - Urinalysis, Routine w reflex microscopic   No follow-ups on file.  Belvie Clara, MD  [facility] [place]

## 2024-05-29 ENCOUNTER — Encounter: Payer: Self-pay | Admitting: Urology

## 2024-05-29 ENCOUNTER — Ambulatory Visit: Payer: Self-pay | Admitting: Urology

## 2024-05-29 LAB — PSA: Prostate Specific Ag, Serum: 4 ng/mL (ref 0.0–4.0)

## 2024-05-29 NOTE — Telephone Encounter (Signed)
 Letter sent

## 2024-05-29 NOTE — Patient Instructions (Signed)
 Balanitis  Balanitis is swelling and irritation of the head of the penis (glans penis). Balanitis occurs most often among males who have not had their foreskin removed (uncircumcised). In uncircumcised males, the condition may also cause inflammation of the skin around the foreskin. Balanitis sometimes causes scarring of the penis or foreskin, which can require surgery. This condition may develop because of an infection or another medical condition. Untreated balanitis can increase the risk of penile cancer. What are the causes? Common causes of this condition include: Irritation and lack of airflow due to fluid (smegma) that can build up on the glans penis. Poor personal hygiene, especially in uncircumcised males. Not cleaning the glans penis and foreskin well can result in a buildup of bacteria, viruses, and yeast, which can lead to infection and inflammation. Other causes include: Chemical irritation from products such as soaps or shower gels, especially those that have fragrance. Chemical irritation can also be caused by condoms, personal lubricants, petroleum jelly, spermicides, fabric softeners, or laundry detergents. Skin conditions, such as eczema, dermatitis, and psoriasis. Allergies to medicines, such as tetracycline and sulfa drugs. What increases the risk? The following factors may make you more likely to develop this condition: Being an uncircumcised male. Having diabetes. Having other medical conditions, including liver cirrhosis, congestive heart failure, or kidney disease. Having infections, such as candidiasis, HPV (human papillomavirus), herpes simplex, gonorrhea, or syphilis. Having a tight foreskin that is difficult to pull back (retract) past the glans penis. Being severely obese. History of reactive arthritis. What are the signs or symptoms? Symptoms of this condition include: Discharge from under the foreskin, and pain or difficulty retracting the foreskin. A bad smell  or itchiness on the penis. Tenderness, redness, and swelling of the glans penis. A rash or sores on the glans penis or foreskin. Inability to get an erection due to pain. Trouble urinating. Scarring of the penis or foreskin, in some cases. How is this diagnosed? This condition may be diagnosed based on a physical exam and tests of a swab of discharge to check for bacterial or fungal infection. You may also have blood tests to check for: Viruses that can cause balanitis. A high blood sugar (glucose) level. This could be a sign of diabetes, which can increase the risk of balanitis. How is this treated? Treatment for this condition depends on the cause. Treatment may include: Improving personal hygiene. Your health care provider may recommend sitting in a bath of warm water that is deep enough to cover your hips and buttocks (sitz bath). Medicines such as: Creams or ointments to reduce swelling (steroids) or to treat an infection. Antibiotic medicine. Antifungal medicine. Having surgery to remove or cut the foreskin (circumcision). This may be done if you have scarring on the foreskin that makes it difficult to retract. Controlling other medical problems that may be causing your condition or making it worse. Follow these instructions at home: Medicines Take over-the-counter and prescription medicines only as told by your health care provider. If you were prescribed an antibiotic medicine, use it as told by your health care provider. Do not stop using the antibiotic even if you start to feel better. General instructions Do not have sex until the condition clears up, or until your health care provider approves. Keep your penis clean and dry. Take sitz baths as recommended by your health care provider. Avoid products that irritate your skin or make symptoms worse, such as soaps and shower gels that have fragrance. Keep all follow-up visits. This is  important. Contact a health care provider  if: Your symptoms get worse or do not improve with home care. You develop chills or a fever. You have trouble urinating. You cannot retract your foreskin. Get help right away if: You develop severe pain. You are unable to urinate. Summary Balanitis is swelling and irritation of the head of the penis (glans penis). This condition is most common among uncircumcised males. Balanitis causes pain, redness, and swelling of the glans penis. Good personal hygiene is important. Treatment may include improving personal hygiene and applying creams or ointments. Contact a health care provider if your symptoms get worse or do not improve with home care. This information is not intended to replace advice given to you by your health care provider. Make sure you discuss any questions you have with your health care provider. Document Revised: 12/03/2020 Document Reviewed: 12/03/2020 Elsevier Patient Education  2024 [employer].

## 2024-06-05 ENCOUNTER — Telehealth: Payer: Self-pay

## 2024-06-05 NOTE — Telephone Encounter (Signed)
 Pt wife called to confirm that pt [facility] to Rx not available, pt wife advised based on the medication list it looks like it was ordered on 11/24 but I will confirm with MD McKenzie LPN to make sure pt voiced her understanding

## 2024-06-06 MED ORDER — AMBULATORY NON FORMULARY MEDICATION: 0.2000 mL | 5 refills | Status: AC | PRN

## 2024-06-06 NOTE — Telephone Encounter (Addendum)
 rx re-faxed with confirmation

## 2024-06-06 NOTE — Addendum Note (Signed)
 Addended by: MALACHY SLICE on: 06/06/2024 04:01 PM   Modules accepted: Orders

## 2025-05-22 ENCOUNTER — Ambulatory Visit: Admitting: Urology
